# Patient Record
Sex: Female | Born: 1990 | Race: Black or African American | Hispanic: No | Marital: Single | Smoking: Never smoker
Health system: Southern US, Community
[De-identification: ages and names within clinical notes are randomized; demographics above are authoritative.]

## PROBLEM LIST (undated history)

## (undated) ENCOUNTER — Inpatient Hospital Stay (HOSPITAL_COMMUNITY): Payer: Self-pay

## (undated) DIAGNOSIS — D689 Coagulation defect, unspecified: Secondary | ICD-10-CM

## (undated) DIAGNOSIS — R519 Headache, unspecified: Secondary | ICD-10-CM

## (undated) DIAGNOSIS — D3910 Neoplasm of uncertain behavior of unspecified ovary: Secondary | ICD-10-CM

## (undated) DIAGNOSIS — Z98891 History of uterine scar from previous surgery: Secondary | ICD-10-CM

## (undated) DIAGNOSIS — O26899 Other specified pregnancy related conditions, unspecified trimester: Secondary | ICD-10-CM

## (undated) DIAGNOSIS — R51 Headache: Secondary | ICD-10-CM

## (undated) DIAGNOSIS — O139 Gestational [pregnancy-induced] hypertension without significant proteinuria, unspecified trimester: Secondary | ICD-10-CM

## (undated) DIAGNOSIS — O1423 HELLP syndrome (HELLP), third trimester: Secondary | ICD-10-CM

## (undated) DIAGNOSIS — G709 Myoneural disorder, unspecified: Secondary | ICD-10-CM

## (undated) DIAGNOSIS — F419 Anxiety disorder, unspecified: Secondary | ICD-10-CM

## (undated) HISTORY — DX: Coagulation defect, unspecified: D68.9

## (undated) HISTORY — PX: OTHER SURGICAL HISTORY: SHX169

## (undated) HISTORY — DX: Headache, unspecified: R51.9

## (undated) HISTORY — DX: Other specified pregnancy related conditions, unspecified trimester: O26.899

## (undated) HISTORY — PX: JOINT REPLACEMENT: SHX530

## (undated) HISTORY — DX: Headache: R51

---
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
## 2011-02-02 LAB — COMPREHENSIVE METABOLIC PANEL
ALT: 15
AST: 23
AST: 26
Albumin: 1.6 — ABNORMAL LOW
Albumin: 2.3 — ABNORMAL LOW
Alkaline Phosphatase: 262 — ABNORMAL HIGH
Alkaline Phosphatase: 310 — ABNORMAL HIGH
BUN: 12
BUN: 12
BUN: 13
CO2: 19
CO2: 19
Calcium: 7 — ABNORMAL LOW
Calcium: 7.6 — ABNORMAL LOW
Calcium: 8.9
Chloride: 106
Chloride: 109
Creatinine, Ser: 1.1
Creatinine, Ser: 1.26 — ABNORMAL HIGH
Glucose, Bld: 108 — ABNORMAL HIGH
Glucose, Bld: 78
Potassium: 3.8
Potassium: 4.3
Sodium: 135
Total Bilirubin: 0.5
Total Bilirubin: 0.7
Total Protein: 3.8 — ABNORMAL LOW
Total Protein: 4.3 — ABNORMAL LOW
Total Protein: 5.7 — ABNORMAL LOW

## 2011-02-02 LAB — URINALYSIS, DIPSTICK ONLY
Bilirubin Urine: NEGATIVE
Glucose, UA: NEGATIVE
Ketones, ur: 15 — AB
pH: 6

## 2011-02-02 LAB — CBC
HCT: 17.8 — ABNORMAL LOW
HCT: 30.2 — ABNORMAL LOW
HCT: 35
Hemoglobin: 10.1 — ABNORMAL LOW
Hemoglobin: 11.5
Hemoglobin: 6 — CL
MCHC: 32.8
MCHC: 33.3
MCV: 86.3
MCV: 86.6
MCV: 86.8
Platelets: 160
Platelets: 183
Platelets: 191
RBC: 2.05 — ABNORMAL LOW
RBC: 3.49 — ABNORMAL LOW
RBC: 4.04
RDW: 13.3
RDW: 13.4
RDW: 13.4
WBC: 11
WBC: 6.8
WBC: 9.5

## 2011-02-02 LAB — BASIC METABOLIC PANEL
Chloride: 109
Potassium: 4.3
Sodium: 137

## 2011-02-02 LAB — MAGNESIUM: Magnesium: 6.4

## 2011-02-02 LAB — LACTATE DEHYDROGENASE
LDH: 244
LDH: 293 — ABNORMAL HIGH
LDH: 341 — ABNORMAL HIGH

## 2011-02-02 LAB — RPR: RPR Ser Ql: NONREACTIVE

## 2011-02-02 LAB — URIC ACID: Uric Acid, Serum: 8.6 — ABNORMAL HIGH

## 2011-07-12 ENCOUNTER — Emergency Department (INDEPENDENT_AMBULATORY_CARE_PROVIDER_SITE_OTHER): Payer: Self-pay

## 2011-07-12 ENCOUNTER — Other Ambulatory Visit: Payer: Self-pay

## 2011-07-12 ENCOUNTER — Encounter (HOSPITAL_COMMUNITY): Payer: Self-pay

## 2011-07-12 ENCOUNTER — Emergency Department (INDEPENDENT_AMBULATORY_CARE_PROVIDER_SITE_OTHER)
Admission: EM | Admit: 2011-07-12 | Discharge: 2011-07-12 | Disposition: A | Discharge status: Home / Self Care | Payer: Self-pay | Source: Home / Self Care | Attending: Emergency Medicine | Admitting: Emergency Medicine

## 2011-07-12 DIAGNOSIS — F410 Panic disorder [episodic paroxysmal anxiety] without agoraphobia: Secondary | ICD-10-CM

## 2011-07-12 DIAGNOSIS — R102 Pelvic and perineal pain: Secondary | ICD-10-CM

## 2011-07-12 DIAGNOSIS — N949 Unspecified condition associated with female genital organs and menstrual cycle: Secondary | ICD-10-CM

## 2011-07-12 DIAGNOSIS — G44209 Tension-type headache, unspecified, not intractable: Secondary | ICD-10-CM

## 2011-07-12 LAB — CBC
HCT: 42.3 % (ref 36.0–46.0)
Hemoglobin: 14.8 g/dL (ref 12.0–15.0)
MCHC: 35 g/dL (ref 30.0–36.0)
MCV: 83.9 fL (ref 78.0–100.0)
RDW: 12.5 % (ref 11.5–15.5)
WBC: 5.9 10*3/uL (ref 4.0–10.5)

## 2011-07-12 LAB — WET PREP, GENITAL: Clue Cells Wet Prep HPF POC: NONE SEEN

## 2011-07-12 LAB — DIFFERENTIAL
Eosinophils Relative: 4 % (ref 0–5)
Lymphocytes Relative: 38 % (ref 12–46)
Monocytes Absolute: 0.4 10*3/uL (ref 0.1–1.0)
Monocytes Relative: 7 % (ref 3–12)
Neutro Abs: 3 10*3/uL (ref 1.7–7.7)

## 2011-07-12 LAB — POCT URINALYSIS DIP (DEVICE)
Hgb urine dipstick: NEGATIVE
Ketones, ur: NEGATIVE mg/dL
Protein, ur: NEGATIVE mg/dL
Specific Gravity, Urine: 1.02 (ref 1.005–1.030)
Urobilinogen, UA: 1 mg/dL (ref 0.0–1.0)

## 2011-07-12 LAB — POCT PREGNANCY, URINE: Preg Test, Ur: NEGATIVE

## 2011-07-12 MED ORDER — LORAZEPAM 1 MG PO TABS: 1.0000 mg | ORAL_TABLET | Freq: Three times a day (TID) | ORAL | Status: AC | PRN

## 2011-07-12 MED ORDER — AMITRIPTYLINE HCL 10 MG PO TABS: 10.0000 mg | ORAL_TABLET | Freq: Every day | ORAL | Status: DC

## 2011-07-12 MED ORDER — NAPROXEN 375 MG PO TABS: 375.0000 mg | ORAL_TABLET | Freq: Two times a day (BID) | ORAL | Status: DC

## 2011-07-12 NOTE — Discharge Instructions (Signed)
Abdominal Pain, Women       Abdominal (stomach, pelvic, or belly) pain can be caused by many things. It is important to tell your doctor:   The location of the pain.   Does it come and go or is it present all the time?   Are there things that start the pain (eating certain foods, exercise)?   Are there other symptoms associated with the pain (fever, nausea, vomiting, diarrhea)?  All of this is helpful to know when trying to find the cause of the pain.   CAUSES   Stomach: virus or bacteria infection, or ulcer.   Intestine: appendicitis (inflamed appendix), regional ileitis (Crohn's disease), ulcerative colitis (inflamed colon), irritable bowel syndrome, diverticulitis (inflamed diverticulum of the colon), or cancer of the stomach or intestine.   Gallbladder disease or stones in the gallbladder.   Kidney disease, kidney stones, or infection.   Pancreas infection or cancer.   Fibromyalgia (pain disorder).   Diseases of the female organs:   Uterus: fibroid (non-cancerous) tumors or infection.   Fallopian tubes: infection or tubal pregnancy.   Ovary: cysts or tumors.   Pelvic adhesions (scar tissue).   Endometriosis (uterus lining tissue growing in the pelvis and on the pelvic organs).   Pelvic congestion syndrome (female organs filling up with blood just before the menstrual period).   Pain with the menstrual period.   Pain with ovulation (producing an egg).   Pain with an IUD (intrauterine device, birth control) in the uterus.   Cancer of the female organs.   Functional pain (pain not caused by a disease, may improve without treatment).   Psychological pain.   Depression.  DIAGNOSIS   Your doctor will decide the seriousness of your pain by doing an examination.   Blood tests.   X-rays.   Ultrasound.   CT scan (computed tomography, special type of X-ray).   MRI (magnetic resonance imaging).   Cultures, for infection.   Barium enema (dye inserted in the large intestine, to better view it with X-rays).   Colonoscopy  (looking in intestine with a lighted tube).   Laparoscopy (minor surgery, looking in abdomen with a lighted tube).   Major abdominal exploratory surgery (looking in abdomen with a large incision).  TREATMENT   The treatment will depend on the cause of the pain.   Many cases can be observed and treated at home.   Over-the-counter medicines recommended by your caregiver.   Prescription medicine.   Antibiotics, for infection.   Birth control pills, for painful periods or for ovulation pain.   Hormone treatment, for endometriosis.   Nerve blocking injections.   Physical therapy.   Antidepressants.   Counseling with a psychologist or psychiatrist.   Minor or major surgery.  HOME CARE INSTRUCTIONS   Do not take laxatives, unless directed by your caregiver.   Take over-the-counter pain medicine only if ordered by your caregiver. Do not take aspirin because it can cause an upset stomach or bleeding.   Try a clear liquid diet (broth or water) as ordered by your caregiver. Slowly move to a bland diet, as tolerated, if the pain is related to the stomach or intestine.   Have a thermometer and take your temperature several times a day, and record it.   Bed rest and sleep, if it helps the pain.   Avoid sexual intercourse, if it causes pain.   Avoid stressful situations.   Keep your follow-up appointments and tests, as your caregiver orders.   If   try:   Acupuncture.   Relaxation exercises (yoga, meditation).   Group therapy.   Counseling.  SEEK MEDICAL CARE IF:   You notice certain foods cause stomach pain.   Your home care treatment is not helping your pain.   You need stronger pain medicine.   You want your IUD removed.   You feel faint or lightheaded.   You develop nausea and vomiting.   You develop a rash.   You are having side effects or  an allergy to your medicine.  SEEK IMMEDIATE MEDICAL CARE IF:   Your pain does not go away or gets worse.   You have a fever.   Your pain is felt only in portions of the abdomen. The right side could possibly be appendicitis. The left lower portion of the abdomen could be colitis or diverticulitis.   You are passing blood in your stools (bright red or black tarry stools, with or without vomiting).   You have blood in your urine.   You develop chills, with or without a fever.   You pass out.  MAKE SURE YOU:   Understand these instructions.   Will watch your condition.   Will get help right away if you are not doing well or get worse.  Document Released: 02/14/2007 Document Revised: 04/08/2011 Document Reviewed: 03/06/2009 [facility] Patient Information 2012 [place], [place].Anxiety and Panic Attacks Anxiety is your body's way of reacting to real danger or something you think is a danger. It may be fear or worry over a situation like losing your job. Sometimes the cause is not known. A panic attack is made up of physical signs like sweating, shaking, or chest pain. Anxiety and panic attacks may start suddenly. They may be strong. They may come at any time of day, even while sleeping. They may come at any time of life. Panic attacks are scary, but they do not harm you physically.  HOME CARE  Avoid any known causes of your anxiety.   Try to relax. Yoga may help. Tell yourself everything will be okay.   Exercise often.   Get expert advice and help (therapy) to stop anxiety or attacks from happening.   Avoid caffeine, alcohol, and drugs.   Only take medicine as told by your doctor.  GET HELP RIGHT AWAY IF:  Your attacks seem different than normal attacks.   Your problems are getting worse or concern you.  MAKE SURE YOU:  Understand these instructions.   Will watch your condition.   Will get help right away if you are not doing well or get worse.  Document Released:  05/22/2010 Document Revised: 04/08/2011 Document Reviewed: 05/22/2010 [facility] Patient Information 2012 [place], [place].Tension Headache (Muscle Contraction Headache) Tension headache is one of the most common causes of head pain. These headaches are usually felt as a pain over the top of your head and back of your neck. Stress, anxiety, and depression are common triggers for these headaches. Tension headaches are not life-threatening and will not lead to other types of headaches. Tension headaches can often be diagnosed by taking a history from the patient and a physical exam. Sometimes, further lab and x-ray studies are used to confirm the diagnosis. Your caregiver can advise you on how to get help solving problems that cause anxiety or stress. Antidepressants can be prescribed if depression is a problem. HOME CARE INSTRUCTIONS   If testing was done, call for your results. Remember, it is your responsibility to get the results of all testing. Do  not assume everything is fine because you do not hear from your caregiver.   Only take over-the-counter or prescription medicines for pain, discomfort, or fever as directed by your caregiver.   Biofeedback, massage, or other relaxation techniques may be helpful.   Ice packs or heat to the head and neck can be used. Use these three to four times per day or as needed.   Physical therapy may be a useful addition to treatment.   If headaches continue, even with therapy, you may need to think about lifestyle changes.   Avoid excessive use of pain killers, as rebound headaches can occur.  SEEK MEDICAL CARE IF:   You develop problems with medications prescribed.   You do not respond or get no relief from medications.   You have a change from the usual headache.   You develop nausea (feeling sick to your stomach) or vomiting.  SEEK IMMEDIATE MEDICAL CARE IF:   Your headache becomes severe.   You have an unexplained oral temperature above 102 F  (38.9 C).   You develop a stiff neck.   You have loss of vision.   You have muscular weakness.   You have loss of muscular control.   You develop severe symptoms different from your first symptoms.   You start losing your balance or have trouble walking.   You feel faint or pass out.  MAKE SURE YOU:   Understand these instructions.   Will watch your condition.   Will get help right away if you are not doing well or get worse.  Document Released: 04/19/2005 Document Revised: 04/08/2011 Document Reviewed: 12/07/2007 [facility] Patient Information 2012 [place], [place].Abdominal Pain, Women Abdominal (stomach, pelvic, or belly) pain can be caused by many things. It is important to tell your doctor:  The location of the pain.   Does it come and go or is it present all the time?   Are there things that start the pain (eating certain foods, exercise)?   Are there other symptoms associated with the pain (fever, nausea, vomiting, diarrhea)?  All of this is helpful to know when trying to find the cause of the pain. CAUSES   Stomach: virus or bacteria infection, or ulcer.   Intestine: appendicitis (inflamed appendix), regional ileitis (Crohn's disease), ulcerative colitis (inflamed colon), irritable bowel syndrome, diverticulitis (inflamed diverticulum of the colon), or cancer of the stomach or intestine.   Gallbladder disease or stones in the gallbladder.   Kidney disease, kidney stones, or infection.   Pancreas infection or cancer.   Fibromyalgia (pain disorder).   Diseases of the female organs:   Uterus: fibroid (non-cancerous) tumors or infection.   Fallopian tubes: infection or tubal pregnancy.   Ovary: cysts or tumors.   Pelvic adhesions (scar tissue).   Endometriosis (uterus lining tissue growing in the pelvis and on the pelvic organs).   Pelvic congestion syndrome (female organs filling up with blood just before the menstrual period).   Pain with the  menstrual period.   Pain with ovulation (producing an egg).   Pain with an IUD (intrauterine device, birth control) in the uterus.   Cancer of the female organs.   Functional pain (pain not caused by a disease, may improve without treatment).   Psychological pain.   Depression.  DIAGNOSIS  Your doctor will decide the seriousness of your pain by doing an examination.  Blood tests.   X-rays.   Ultrasound.   CT scan (computed tomography, special type of X-ray).   MRI (magnetic  resonance imaging).   Cultures, for infection.   Barium enema (dye inserted in the large intestine, to better view it with X-rays).   Colonoscopy (looking in intestine with a lighted tube).   Laparoscopy (minor surgery, looking in abdomen with a lighted tube).   Major abdominal exploratory surgery (looking in abdomen with a large incision).  TREATMENT  The treatment will depend on the cause of the pain.   Many cases can be observed and treated at home.   Over-the-counter medicines recommended by your caregiver.   Prescription medicine.   Antibiotics, for infection.   Birth control pills, for painful periods or for ovulation pain.   Hormone treatment, for endometriosis.   Nerve blocking injections.   Physical therapy.   Antidepressants.   Counseling with a psychologist or psychiatrist.   Minor or major surgery.  HOME CARE INSTRUCTIONS   Do not take laxatives, unless directed by your caregiver.   Take over-the-counter pain medicine only if ordered by your caregiver. Do not take aspirin because it can cause an upset stomach or bleeding.   Try a clear liquid diet (broth or water) as ordered by your caregiver. Slowly move to a bland diet, as tolerated, if the pain is related to the stomach or intestine.   Have a thermometer and take your temperature several times a day, and record it.   Bed rest and sleep, if it helps the pain.   Avoid sexual intercourse, if it causes pain.    Avoid stressful situations.   Keep your follow-up appointments and tests, as your caregiver orders.   If the pain does not go away with medicine or surgery, you may try:   Acupuncture.   Relaxation exercises (yoga, meditation).   Group therapy.   Counseling.  SEEK MEDICAL CARE IF:   You notice certain foods cause stomach pain.   Your home care treatment is not helping your pain.   You need stronger pain medicine.   You want your IUD removed.   You feel faint or lightheaded.   You develop nausea and vomiting.   You develop a rash.   You are having side effects or an allergy to your medicine.  SEEK IMMEDIATE MEDICAL CARE IF:   Your pain does not go away or gets worse.   You have a fever.   Your pain is felt only in portions of the abdomen. The right side could possibly be appendicitis. The left lower portion of the abdomen could be colitis or diverticulitis.   You are passing blood in your stools (bright red or black tarry stools, with or without vomiting).   You have blood in your urine.   You develop chills, with or without a fever.   You pass out.  MAKE SURE YOU:   Understand these instructions.   Will watch your condition.   Will get help right away if you are not doing well or get worse.  Document Released: 02/14/2007 Document Revised: 04/08/2011 Document Reviewed: 03/06/2009 [facility] Patient Information 2012 [place], [place].

## 2011-07-12 NOTE — ED Notes (Signed)
[phone] or [phone] for test reports

## 2011-07-12 NOTE — ED Notes (Signed)
States for several months she has had tightness in breasts/chest, nausea, headaches, abdominal cramping and it hurts to lie on her abdomen at night.  Reports she has intermittently noticed knots in the back of her legs- states she noted one last pm.  Pt first stated she had last depo shot in December 2012 and then said she may have missed that one. Reports she can't see her OB/Gyn because she owes them money.

## 2011-07-12 NOTE — ED Provider Notes (Signed)
Chief Complaint  Patient presents with  . Nausea  . Abdominal Cramping    History of Present Illness:   The patient is a 21 year old female who presents with a number of complaints today all of which have been going on for about 2-3 months: Recurring headaches, nausea, chest tightness and pain, and lower abdominal pain.  She has had recurring headaches for the past 2 or 3 months. These are located in the right frontal area and feels like a pressure. They're rated as a 9 or 10 in intensity. They tend to occur in the morning, after physical education, after getting home from school. They usually occur about 3 times a day and last about 45 minutes. They're associated with photophobia but no photophobia and no associated nausea or vomiting. She states her legs have felt numb for months she's felt stressed, anxious, and not getting enough sleep. She denies being depressed. Denies any other neurological symptoms.   Also for the past 2-3 months she's felt intermittently nauseated. This tends to come on after the headaches and can last for 30 minutes at a time. She states her appetite is good her weight is up. She denies any abdominal pain, vomiting, or diarrhea. She uses the Provera shots for birth control. She thinks her last one was in December. Her last menses were in November. She is sexually active and  Also for the past 2 weeks she's had intermittent episodes of chest tightness and pain occurring about every 3 days. These can last about 3 minutes at a time. She denies any associated palpitations or shortness of breath.  Also for the past 4 months she's had intermittent bilateral lower abdominal pain. The pain can last for hours at a time or sometimes all night. It's worse if she lies flat on her abdomen. It's described as a stabbing pain is rated 8/10 in intensity. She's had some vaginal discharge. No dysuria, frequency, urgency, or hematuria.  Review of Systems:  Other than noted above, the patient  denies any of the following symptoms. Systemic:  No fever, chills, sweats, fatigue, myalgias, headache, or anorexia. Eye:  No redness, pain or drainage. ENT:  No earache, nasal congestion, rhinorrhea, sinus pressure, or sore throat. Lungs:  No cough, sputum production, wheezing, shortness of breath.  Cardiovascular:  No chest pain, palpitations, or syncope. GI:  No nausea, vomiting, abdominal pain or diarrhea. GU:  No dysuria, frequency, or hematuria. Skin:  No rash or pruritis.  PMFSH:  Past medical history, family history, social history, meds, and allergies were reviewed.  Physical Exam:   Vital signs:  BP 122/67  Pulse 80  Temp(Src) 98.8 F (37.1 C) (Oral)  Resp 14  SpO2 98% General:  Alert, in no distress. Eye:  PERRL, full EOMs.  Lids and conjunctivas were normal. ENT:  TMs and canals were normal, without erythema or inflammation.  Nasal mucosa was clear and uncongested, without drainage.  Mucous membranes were moist.  Pharynx was clear, without exudate or drainage.  There were no oral ulcerations or lesions. Neck:  Supple, no adenopathy, tenderness or mass. Thyroid was normal. Lungs:  No respiratory distress.  Lungs were clear to auscultation, without wheezes, rales or rhonchi.  Breath sounds were clear and equal bilaterally. Heart:  Regular rhythm, without gallops, murmers or rubs. Abdomen:  Soft, flat, and non-tender to palpation.  No hepatosplenomagaly or mass. Pelvic: Normal external genitalia, vaginal and cervical mucosa unremarkable, no cervical motion tenderness, uterus normal size and nontender, no adnexal mass or tenderness  on the left, moderate adnexal tenderness and no mass on the right. Neurological exam: Alert and oriented x3. Speech was clear and appropriate. Cranial nerves were intact. No pronator drift, finger to nose normal, hand grips equal and DTRs were 2+ and symmetrical. Muscle strength and sensation were normal. Skin:  Clear, warm, and dry, without rash or  lesions.  Labs:   Results for orders placed during the hospital encounter of 07/12/11  POCT URINALYSIS DIP (DEVICE)      Component Value Range   Glucose, UA NEGATIVE  NEGATIVE (mg/dL)   Bilirubin Urine NEGATIVE  NEGATIVE    Ketones, ur NEGATIVE  NEGATIVE (mg/dL)   Specific Gravity, Urine 1.020  1.005 - 1.030    Hgb urine dipstick NEGATIVE  NEGATIVE    pH 7.0  5.0 - 8.0    Protein, ur NEGATIVE  NEGATIVE (mg/dL)   Urobilinogen, UA 1.0  0.0 - 1.0 (mg/dL)   Nitrite NEGATIVE  NEGATIVE    Leukocytes, UA NEGATIVE  NEGATIVE   POCT PREGNANCY, URINE      Component Value Range   Preg Test, Ur NEGATIVE  NEGATIVE   CBC      Component Value Range   WBC 5.9  4.0 - 10.5 (K/uL)   RBC 5.04  3.87 - 5.11 (MIL/uL)   Hemoglobin 14.8  12.0 - 15.0 (g/dL)   HCT 40.9  81.1 - 91.4 (%)   MCV 83.9  78.0 - 100.0 (fL)   MCH 29.4  26.0 - 34.0 (pg)   MCHC 35.0  30.0 - 36.0 (g/dL)   RDW 78.2  95.6 - 21.3 (%)   Platelets 242  150 - 400 (K/uL)  DIFFERENTIAL      Component Value Range   Neutrophils Relative 51  43 - 77 (%)   Neutro Abs 3.0  1.7 - 7.7 (K/uL)   Lymphocytes Relative 38  12 - 46 (%)   Lymphs Abs 2.3  0.7 - 4.0 (K/uL)   Monocytes Relative 7  3 - 12 (%)   Monocytes Absolute 0.4  0.1 - 1.0 (K/uL)   Eosinophils Relative 4  0 - 5 (%)   Eosinophils Absolute 0.2  0.0 - 0.7 (K/uL)   Basophils Relative 1  0 - 1 (%)   Basophils Absolute 0.0  0.0 - 0.1 (K/uL)  WET PREP, GENITAL      Component Value Range   Yeast Wet Prep HPF POC NONE SEEN  NONE SEEN    Trich, Wet Prep NONE SEEN  NONE SEEN    Clue Cells Wet Prep HPF POC NONE SEEN  NONE SEEN    WBC, Wet Prep HPF POC FEW (*) NONE SEEN      Radiology:  Dg Chest 2 View  07/12/2011  *RADIOLOGY REPORT*  Clinical Data: Chest pain, pressure.  CHEST - 2 VIEW  Comparison: None.  Findings: Heart and mediastinal contours are within normal limits. No focal opacities or effusions.  No acute bony abnormality.  IMPRESSION: No active cardiopulmonary disease.  Original  Report Authenticated By: Cyndie Chime, M.D.    Date: 07/12/2011  Rate: 69  Rhythm: normal sinus rhythm  QRS Axis: normal  Intervals: normal  ST/T Wave abnormalities: normal  Conduction Disutrbances:none  Narrative Interpretation: Normal sinus rhythm, normal EKG.  Old EKG Reviewed: none available    Assessment:   Diagnoses that have been ruled out:  None  Diagnoses that are still under consideration:  None  Final diagnoses:  Tension headache  Anxiety attack  Pelvic pain -  I don't think she has PID or an STD. She may have very insistent on the right or fibroid tumor as a cause for her pain. I think she needs further evaluation suggested following up with gynecologist.     Plan:   1.  The following meds were prescribed:   New Prescriptions   AMITRIPTYLINE (ELAVIL) 10 MG TABLET    Take 1 tablet (10 mg total) by mouth at bedtime.   LORAZEPAM (ATIVAN) 1 MG TABLET    Take 1 tablet (1 mg total) by mouth 3 (three) times daily as needed for anxiety.   NAPROXEN (NAPROSYN) 375 MG TABLET    Take 1 tablet (375 mg total) by mouth 2 (two) times daily with a meal.   2.  The patient was instructed in symptomatic care and handouts were given. 3.  The patient was told to return if becoming worse in any way, if no better in 3 or 4 days, and given some red flag symptoms that would indicate earlier return.  Follow up:  The patient was told to follow up with Dr. Deidre Ala for the pelvic pain in one week, and with Dr. Yolonda Kida if headaches aren't any better in a month.    Reuben Likes, MD 07/12/11 [phone]

## 2011-08-06 ENCOUNTER — Emergency Department (HOSPITAL_COMMUNITY): Payer: Medicaid Other

## 2011-08-06 ENCOUNTER — Emergency Department (HOSPITAL_COMMUNITY)
Admission: EM | Admit: 2011-08-06 | Discharge: 2011-08-06 | Disposition: A | Discharge status: Home / Self Care | Payer: Medicaid Other | Attending: Emergency Medicine | Admitting: Emergency Medicine

## 2011-08-06 ENCOUNTER — Encounter (HOSPITAL_COMMUNITY): Payer: Self-pay | Admitting: *Deleted

## 2011-08-06 DIAGNOSIS — O200 Threatened abortion: Secondary | ICD-10-CM | POA: Insufficient documentation

## 2011-08-06 LAB — CBC
HCT: 41.9 % (ref 36.0–46.0)
MCH: 29.5 pg (ref 26.0–34.0)
MCV: 84.6 fL (ref 78.0–100.0)
RBC: 4.95 MIL/uL (ref 3.87–5.11)
RDW: 12.8 % (ref 11.5–15.5)
WBC: 6.6 10*3/uL (ref 4.0–10.5)

## 2011-08-06 LAB — URINALYSIS, ROUTINE W REFLEX MICROSCOPIC
Nitrite: NEGATIVE
Specific Gravity, Urine: 1.024 (ref 1.005–1.030)
Urobilinogen, UA: 0.2 mg/dL (ref 0.0–1.0)

## 2011-08-06 LAB — COMPREHENSIVE METABOLIC PANEL
CO2: 23 mEq/L (ref 19–32)
Calcium: 8.6 mg/dL (ref 8.4–10.5)
Creatinine, Ser: 0.78 mg/dL (ref 0.50–1.10)
GFR calc Af Amer: >90 mL/min (ref >90)
GFR calc non Af Amer: >90 mL/min (ref >90)
Glucose, Bld: 132 mg/dL — ABNORMAL HIGH (ref 70–99)

## 2011-08-06 LAB — DIFFERENTIAL
Eosinophils Relative: 1 % (ref 0–5)
Lymphocytes Relative: 30 % (ref 12–46)
Lymphs Abs: 2 10*3/uL (ref 0.7–4.0)
Monocytes Absolute: 0.3 10*3/uL (ref 0.1–1.0)

## 2011-08-06 LAB — POCT PREGNANCY, URINE: Preg Test, Ur: POSITIVE — AB

## 2011-08-06 LAB — RH IG WORKUP (INCLUDES ABO/RH)

## 2011-08-06 LAB — LIPASE, BLOOD: Lipase: 17 U/L (ref 11–59)

## 2011-08-06 MED ORDER — SODIUM CHLORIDE 0.9 % IV SOLN
1000.0000 mL | Freq: Once | INTRAVENOUS | Status: AC
  Administered 2011-08-06: 1000 mL via INTRAVENOUS

## 2011-08-06 MED ORDER — SODIUM CHLORIDE 0.9 % IV SOLN: 1000.0000 mL | INTRAVENOUS | Status: DC

## 2011-08-06 MED ORDER — HYDROMORPHONE HCL PF 1 MG/ML IJ SOLN
1.0000 mg | Freq: Once | INTRAMUSCULAR | Status: AC
  Administered 2011-08-06: 1 mg via INTRAVENOUS
  Filled 2011-08-06: qty 1

## 2011-08-06 MED ORDER — ONDANSETRON HCL 4 MG/2ML IJ SOLN
4.0000 mg | Freq: Once | INTRAMUSCULAR | Status: AC
  Administered 2011-08-06: 4 mg via INTRAVENOUS
  Filled 2011-08-06: qty 2

## 2011-08-06 NOTE — ED Notes (Signed)
Patient states that abdominal pain started this morning about 6am. She described the pain a sharp pain that is constant.  She rates her  Pain at a 8 of 10 currently.  She confirms nausea, vomiting and diarrhea She denies blood in diarrhea or vomit

## 2011-08-06 NOTE — ED Notes (Signed)
Patient is alert and oriented x3.  She was given DC instructions and follow up visit instructions.  Patient gave verbal understanding. She was DC ambulatory under his own power to home.  V/S stable.  He was not showing any signs of distress on DC 

## 2011-08-06 NOTE — Discharge Instructions (Signed)
Threatened Miscarriage  A threatened miscarriage is a pregnancy that may end. It may be marked by bleeding during the first 20 weeks of pregnancy. Often, the pregnancy can continue without any more problems. You may be asked to stop:  Having sex (intercourse).   Having orgasms.   Using tampons.   Exercising.   Doing heavy physical activity and work.  HOME CARE   Your doctor may tell you to take bed rest and to stop activities and work.   Write down the number of pads you use each day. Write down how often you change pads. Write down how soaked they are.   Follow your doctor's advice for follow-up visits and tests.   If your blood type is Rh-negative and the father's blood is Rh-positive (or is not known), you may get a shot to protect the baby.   If you have a miscarriage, save all the tissue you pass in a container. Take the container to your doctor.  GET HELP RIGHT AWAY IF:   You have bad cramps or pain in your belly (abdomen), lower belly, or back.   You have a fever or chills.   Your bleeding gets worse or you pass large clots of blood or tissue. Save this tissue to show your doctor.   You feel lightheaded, weak, dizzy, or pass out (faint).   You have a gush of fluid from your vagina.  MAKE SURE YOU:   Understand these instructions.   Will watch your condition.   Will get help right away if you are not doing well or get worse.  Document Released: 04/01/2008 Document Revised: 04/08/2011 Document Reviewed: 05/05/2009 [employer] Patient Information 2012 [employer]. 
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
## 2011-09-06 ENCOUNTER — Other Ambulatory Visit (HOSPITAL_COMMUNITY): Payer: Self-pay | Admitting: Obstetrics and Gynecology

## 2011-09-06 DIAGNOSIS — O26849 Uterine size-date discrepancy, unspecified trimester: Secondary | ICD-10-CM

## 2011-09-07 ENCOUNTER — Ambulatory Visit (HOSPITAL_COMMUNITY)
Admission: RE | Admit: 2011-09-07 | Discharge: 2011-09-07 | Disposition: A | Discharge status: Home / Self Care | Payer: Medicaid Other | Source: Ambulatory Visit | Attending: Obstetrics and Gynecology | Admitting: Obstetrics and Gynecology

## 2011-09-07 DIAGNOSIS — O209 Hemorrhage in early pregnancy, unspecified: Secondary | ICD-10-CM | POA: Insufficient documentation

## 2011-09-07 DIAGNOSIS — R1031 Right lower quadrant pain: Secondary | ICD-10-CM | POA: Insufficient documentation

## 2011-09-07 DIAGNOSIS — O26849 Uterine size-date discrepancy, unspecified trimester: Secondary | ICD-10-CM

## 2011-10-04 LAB — OB RESULTS CONSOLE RPR: RPR: NONREACTIVE

## 2011-10-06 LAB — OB RESULTS CONSOLE ABO/RH

## 2011-11-12 ENCOUNTER — Ambulatory Visit (INDEPENDENT_AMBULATORY_CARE_PROVIDER_SITE_OTHER): Payer: Medicaid Other | Admitting: Psychiatry

## 2011-11-12 ENCOUNTER — Encounter (HOSPITAL_COMMUNITY): Payer: Self-pay | Admitting: Psychiatry

## 2011-11-12 DIAGNOSIS — F4322 Adjustment disorder with anxiety: Secondary | ICD-10-CM

## 2011-11-12 NOTE — Progress Notes (Signed)
Psychiatric Assessment Adult  Patient Identification:  Carla [name] Date of Evaluation:  11/12/2011 Chief Complaint: I keep on jumping on the bedHistory of Chief Complaint:  No chief complaint on file.  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 The patient is clearly intimidated by her social environment. She is very guarded and says that sometimes she thinks people are talking about her. Presently she is 4 months pregnant. She lives with her mother her 2 brothers her niece her boyfriend and her 39-year-old son. She feels good about her boyfriend has been with her for 6 years. This patient denies daily depression. She denies any problems with sleep appetite or energy. The patient not anhedonic as she likes music and likes to cut hair. She is a good sense of worth and denies ever being suicidal. The patient denies the use of alcohol or drugs. She has never been psychotic. She denies any symptoms of mania the denies any symptoms of any specific anxiety disorder. All their some obsessive features about her she does not meet the criteria of OCD. She likes things ordered but that's awaits been since she was a young child. This patient was seen alone. HPI Review of Systems Physical Exam  Depressive Symptoms: anxiety,  (Hypo) Manic Symptoms:   Elevated Mood:  No Irritable Mood:  No Grandiosity:  No Distractibility:  No Labiality of Mood:  No Delusions:  No Hallucinations:  No Impulsivity:  No Sexually Inappropriate Behavior:  No Financial Extravagance:  No Flight of Ideas:  No  Anxiety Symptoms: Excessive Worry:  No Panic Symptoms:  No Agoraphobia:  No Obsessive Compulsive: No  Symptoms: nonr Specific Phobias:  No Social Anxiety:  No  Psychotic Symptoms:  Hallucinations: No None  Delusions:  No Paranoia:  No   Ideas of  Reference:  No  PTSD Symptoms: Ever had a traumatic exposure:  No Had a traumatic exposure in the last month:  No Re-experiencing: No None Hypervigilance:  No Hyperarousal: No None Avoidance: Yes None  Traumatic Brain Injury: No   Past Psychiatric History:none                Past Medical History:  No past medical history on file. History of Loss of Consciousness:  No Seizure History:  No Cardiac History:  No Allergies:  No Known Allergies Current Medications:  Current Outpatient Prescriptions  Medication Sig Dispense Refill  . amitriptyline (ELAVIL) 10 MG tablet Take 1 tablet (10 mg total) by mouth at bedtime.  30 tablet  0  . naproxen (NAPROSYN) 375 MG tablet Take 1 tablet (375 mg total) by mouth 2 (two) times daily with a meal.  30 tablet  0  . OVER THE COUNTER MEDICATION Take 1 tablet by mouth daily as needed. OTC nausea pill.        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 Legal History:  Hobbies/Interests:   Family History:  No family history on file.  Mental Status Examination/Evaluation: Objective:  Appearance: Casual  Eye Contact::  Good  Speech:  Normal Rate  Volume:  Normal  Mood:  Depressed  Affect:  Appropriate    Thought Process:  Coherent  Orientation:  Full  Thought Content:  WDL  Suicidal Thoughts:  No  Homicidal Thoughts:  No  Judgement:  Good  Insight:  Good  Psychomotor Activity:  Normal  Akathisia:  No  Handed:  Right  AIMS (if indicated):    Assets:  Communication Skills    Laboratory/X-Ray Psychological Evaluation(s)        Assessment:  Axis I: Adjustment Disorder with Anxiety  AXIS I Adjustment Disorder with Anxiety  AXIS II Deferred  AXIS III No past medical history on file.   AXIS IV other psychosocial or environmental problems  AXIS V 61-70 mild symptoms   Treatment Plan/Recommendations:  Plan of Care: At this time and having difficulty defining a specific anxiety disorder. Even if that was the case I would be hesitant to give this pregnant young woman a psychotropic agent. I think her next appointment would be more beneficial if she was seen together with some family. Nonetheless apparently was involved in her life are concerned about how anxious she is. She says they're concerned that she is jumping on the bed and says that she has wracked a number of beds. She has difficulty being out in public. At this time I will go ahead and refer her to [facility] Slim with the assumption that her second pregnancy at age 83 main fact be producing some issues. This patient return to see me in 7 weeks.   Laboratory:    Psychotherapy: Refer ti  Forde Radon  Medications: none  Routine PRN Medications:  No  Consultations:   Safety Concerns:    Other:      Lucas Mallow, MD 7/12/201311:49 AM

## 2011-12-16 ENCOUNTER — Ambulatory Visit (HOSPITAL_COMMUNITY): Payer: Medicaid Other | Admitting: Psychology

## 2011-12-31 ENCOUNTER — Inpatient Hospital Stay (HOSPITAL_COMMUNITY)
Admission: AD | Admit: 2011-12-31 | Discharge: 2012-01-04 | DRG: 765 | Disposition: A | Discharge status: Home / Self Care | Payer: Medicaid Other | Source: Ambulatory Visit | Attending: Obstetrics and Gynecology | Admitting: Obstetrics and Gynecology

## 2011-12-31 ENCOUNTER — Ambulatory Visit (HOSPITAL_COMMUNITY): Payer: Medicaid Other | Admitting: Psychiatry

## 2011-12-31 ENCOUNTER — Other Ambulatory Visit: Payer: Self-pay | Admitting: Obstetrics and Gynecology

## 2011-12-31 ENCOUNTER — Encounter (HOSPITAL_COMMUNITY): Payer: Self-pay | Admitting: *Deleted

## 2011-12-31 ENCOUNTER — Inpatient Hospital Stay (HOSPITAL_COMMUNITY): Payer: Medicaid Other | Admitting: Anesthesiology

## 2011-12-31 ENCOUNTER — Inpatient Hospital Stay (HOSPITAL_COMMUNITY): Payer: Medicaid Other

## 2011-12-31 ENCOUNTER — Encounter (HOSPITAL_COMMUNITY): Payer: Self-pay | Admitting: Anesthesiology

## 2011-12-31 DIAGNOSIS — Z98891 History of uterine scar from previous surgery: Secondary | ICD-10-CM

## 2011-12-31 DIAGNOSIS — O1423 HELLP syndrome (HELLP), third trimester: Secondary | ICD-10-CM

## 2011-12-31 DIAGNOSIS — O1414 Severe pre-eclampsia complicating childbirth: Principal | ICD-10-CM | POA: Diagnosis present

## 2011-12-31 DIAGNOSIS — O368990 Maternal care for other specified fetal problems, unspecified trimester, not applicable or unspecified: Secondary | ICD-10-CM | POA: Diagnosis not present

## 2011-12-31 HISTORY — DX: Gestational (pregnancy-induced) hypertension without significant proteinuria, unspecified trimester: O13.9

## 2011-12-31 HISTORY — DX: HELLP syndrome (HELLP), third trimester: O14.23

## 2011-12-31 HISTORY — DX: Myoneural disorder, unspecified: G70.9

## 2011-12-31 HISTORY — DX: Anxiety disorder, unspecified: F41.9

## 2011-12-31 HISTORY — DX: History of uterine scar from previous surgery: Z98.891

## 2011-12-31 LAB — COMPREHENSIVE METABOLIC PANEL
ALT: 61 U/L — ABNORMAL HIGH (ref 0–35)
BUN: 18 mg/dL (ref 6–23)
CO2: 20 mEq/L (ref 19–32)
CO2: 18 mEq/L — ABNORMAL LOW (ref 19–32)
Calcium: 8 mg/dL — ABNORMAL LOW (ref 8.4–10.5)
Calcium: 7.9 mg/dL — ABNORMAL LOW (ref 8.4–10.5)
Chloride: 105 mEq/L (ref 96–112)
Creatinine, Ser: 1.68 mg/dL — ABNORMAL HIGH (ref 0.50–1.10)
GFR calc Af Amer: 50 mL/min — ABNORMAL LOW (ref >90)
GFR calc Af Amer: 51 mL/min — ABNORMAL LOW (ref >90)
GFR calc non Af Amer: 43 mL/min — ABNORMAL LOW (ref >90)
GFR calc non Af Amer: 44 mL/min — ABNORMAL LOW (ref >90)
Glucose, Bld: 64 mg/dL — ABNORMAL LOW (ref 70–99)
Glucose, Bld: 123 mg/dL — ABNORMAL HIGH (ref 70–99)
Sodium: 135 mEq/L (ref 135–145)
Total Bilirubin: 0.4 mg/dL (ref 0.3–1.2)
Total Protein: 5.2 g/dL — ABNORMAL LOW (ref 6.0–8.3)

## 2011-12-31 LAB — RPR: RPR Ser Ql: NONREACTIVE

## 2011-12-31 LAB — CBC
HCT: 39.5 % (ref 36.0–46.0)
Hemoglobin: 13.6 g/dL (ref 12.0–15.0)
Hemoglobin: 14.1 g/dL (ref 12.0–15.0)
MCH: 30.1 pg (ref 26.0–34.0)
MCV: 86.1 fL (ref 78.0–100.0)
MCV: 85.5 fL (ref 78.0–100.0)
RBC: 4.52 MIL/uL (ref 3.87–5.11)
RBC: 4.62 MIL/uL (ref 3.87–5.11)
WBC: 10.3 10*3/uL (ref 4.0–10.5)
WBC: 13.3 10*3/uL — ABNORMAL HIGH (ref 4.0–10.5)

## 2011-12-31 LAB — TYPE AND SCREEN

## 2011-12-31 MED ORDER — PENICILLIN G POTASSIUM 5000000 UNITS IJ SOLR
2.5000 10*6.[IU] | INTRAVENOUS | Status: DC
  Administered 2011-12-31: 2.5 10*6.[IU] via INTRAVENOUS
  Filled 2011-12-31 (×6): qty 2.5

## 2011-12-31 MED ORDER — SODIUM CHLORIDE 0.9 % IV SOLN: 250.0000 mL | INTRAVENOUS | Status: DC | PRN

## 2011-12-31 MED ORDER — LACTATED RINGERS IV SOLN: 500.0000 mL | Freq: Once | INTRAVENOUS | Status: DC

## 2011-12-31 MED ORDER — LACTATED RINGERS IV SOLN: 500.0000 mL | INTRAVENOUS | Status: DC | PRN

## 2011-12-31 MED ORDER — DOCUSATE SODIUM 100 MG PO CAPS: 100.0000 mg | ORAL_CAPSULE | Freq: Every day | ORAL | Status: DC

## 2011-12-31 MED ORDER — CITRIC ACID-SODIUM CITRATE 334-500 MG/5ML PO SOLN
30.0000 mL | ORAL | Status: DC | PRN
  Administered 2012-01-01: 30 mL via ORAL
  Filled 2011-12-31: qty 15

## 2011-12-31 MED ORDER — MAGNESIUM SULFATE 40 G IN LACTATED RINGERS - SIMPLE
2.0000 g/h | INTRAVENOUS | Status: DC
  Administered 2011-12-31: 2 g/h via INTRAVENOUS
  Filled 2011-12-31: qty 500

## 2011-12-31 MED ORDER — FENTANYL 2.5 MCG/ML BUPIVACAINE 1/10 % EPIDURAL INFUSION (WH - ANES)
14.0000 mL/h | INTRAMUSCULAR | Status: DC
  Filled 2011-12-31: qty 60

## 2011-12-31 MED ORDER — LIDOCAINE HCL (PF) 1 % IJ SOLN: 30.0000 mL | INTRAMUSCULAR | Status: DC | PRN

## 2011-12-31 MED ORDER — ALPRAZOLAM 0.25 MG PO TABS: 0.2500 mg | ORAL_TABLET | Freq: Three times a day (TID) | ORAL | Status: DC | PRN

## 2011-12-31 MED ORDER — ONDANSETRON HCL 4 MG/2ML IJ SOLN
4.0000 mg | Freq: Four times a day (QID) | INTRAMUSCULAR | Status: DC | PRN
  Administered 2012-01-01: 4 mg via INTRAVENOUS
  Filled 2011-12-31: qty 2

## 2011-12-31 MED ORDER — DEXTROSE 5 % IV SOLN
5.0000 10*6.[IU] | Freq: Once | INTRAVENOUS | Status: AC
  Administered 2011-12-31: 5 10*6.[IU] via INTRAVENOUS
  Filled 2011-12-31: qty 5

## 2011-12-31 MED ORDER — PHENYLEPHRINE 40 MCG/ML (10ML) SYRINGE FOR IV PUSH (FOR BLOOD PRESSURE SUPPORT): 80.0000 ug | PREFILLED_SYRINGE | INTRAVENOUS | Status: DC | PRN

## 2011-12-31 MED ORDER — TERBUTALINE SULFATE 1 MG/ML IJ SOLN: 0.2500 mg | Freq: Once | INTRAMUSCULAR | Status: AC | PRN

## 2011-12-31 MED ORDER — ACETAMINOPHEN 325 MG PO TABS: 650.0000 mg | ORAL_TABLET | ORAL | Status: DC | PRN

## 2011-12-31 MED ORDER — CALCIUM CARBONATE ANTACID 500 MG PO CHEW: 2.0000 | CHEWABLE_TABLET | ORAL | Status: DC | PRN

## 2011-12-31 MED ORDER — EPHEDRINE 5 MG/ML INJ: 10.0000 mg | INTRAVENOUS | Status: DC | PRN

## 2011-12-31 MED ORDER — DIPHENHYDRAMINE HCL 50 MG/ML IJ SOLN: 12.5000 mg | INTRAMUSCULAR | Status: DC | PRN

## 2011-12-31 MED ORDER — PRENATAL MULTIVITAMIN CH: 1.0000 | ORAL_TABLET | Freq: Every day | ORAL | Status: DC

## 2011-12-31 MED ORDER — BETAMETHASONE SOD PHOS & ACET 6 (3-3) MG/ML IJ SUSP
12.0000 mg | INTRAMUSCULAR | Status: DC
  Administered 2011-12-31: 12 mg via INTRAMUSCULAR
  Filled 2011-12-31 (×2): qty 2

## 2011-12-31 MED ORDER — MAGNESIUM SULFATE BOLUS VIA INFUSION
4.0000 g | Freq: Once | INTRAVENOUS | Status: AC
  Administered 2011-12-31: 4 g via INTRAVENOUS
  Filled 2011-12-31: qty 500

## 2011-12-31 MED ORDER — MISOPROSTOL 25 MCG QUARTER TABLET
25.0000 ug | ORAL_TABLET | ORAL | Status: DC | PRN
  Administered 2011-12-31 (×2): 25 ug via VAGINAL
  Filled 2011-12-31 (×2): qty 0.25

## 2011-12-31 MED ORDER — IBUPROFEN 600 MG PO TABS: 600.0000 mg | ORAL_TABLET | Freq: Four times a day (QID) | ORAL | Status: DC | PRN

## 2011-12-31 MED ORDER — OXYCODONE-ACETAMINOPHEN 5-325 MG PO TABS: 1.0000 | ORAL_TABLET | ORAL | Status: DC | PRN

## 2011-12-31 MED ORDER — OXYTOCIN 40 UNITS IN LACTATED RINGERS INFUSION - SIMPLE MED: 62.5000 mL/h | Freq: Once | INTRAVENOUS | Status: DC

## 2011-12-31 MED ORDER — SODIUM CHLORIDE 0.9 % IJ SOLN: 3.0000 mL | INTRAMUSCULAR | Status: DC | PRN

## 2011-12-31 MED ORDER — FLEET ENEMA 7-19 GM/118ML RE ENEM: 1.0000 | ENEMA | RECTAL | Status: DC | PRN

## 2011-12-31 MED ORDER — PHENYLEPHRINE 40 MCG/ML (10ML) SYRINGE FOR IV PUSH (FOR BLOOD PRESSURE SUPPORT)
80.0000 ug | PREFILLED_SYRINGE | INTRAVENOUS | Status: DC | PRN
  Filled 2011-12-31 (×2): qty 5

## 2011-12-31 MED ORDER — LACTATED RINGERS IV SOLN
INTRAVENOUS | Status: DC
  Administered 2011-12-31: via INTRAVENOUS

## 2011-12-31 MED ORDER — ZOLPIDEM TARTRATE 5 MG PO TABS: 5.0000 mg | ORAL_TABLET | Freq: Every evening | ORAL | Status: DC | PRN

## 2011-12-31 MED ORDER — EPHEDRINE 5 MG/ML INJ
10.0000 mg | INTRAVENOUS | Status: DC | PRN
  Filled 2011-12-31 (×2): qty 4

## 2011-12-31 MED ORDER — SODIUM CHLORIDE 0.9 % IJ SOLN: 3.0000 mL | Freq: Two times a day (BID) | INTRAMUSCULAR | Status: DC

## 2011-12-31 MED ORDER — HYDRALAZINE HCL 20 MG/ML IJ SOLN
5.0000 mg | Freq: Once | INTRAMUSCULAR | Status: AC
  Administered 2011-12-31: 5 mg via INTRAVENOUS
  Filled 2011-12-31: qty 1

## 2011-12-31 MED ORDER — OXYTOCIN 40 UNITS IN LACTATED RINGERS INFUSION - SIMPLE MED
1.0000 m[IU]/min | INTRAVENOUS | Status: DC
  Filled 2011-12-31: qty 1000

## 2011-12-31 MED ORDER — OXYTOCIN BOLUS FROM INFUSION
250.0000 mL | Freq: Once | INTRAVENOUS | Status: DC
  Filled 2011-12-31: qty 500

## 2011-12-31 NOTE — H&P (Signed)
Carla [name] is a 21 y.o. female presenting for elevated BP and proteinuria, HELLP syndrome by labs.  +FM, no LOF, no VB, occ HA, chest pressure, HA, no RUQ pain. Swelling. Maternal Medical History:  Reason for admission: Reason for admission: nausea.  Contractions: Frequency: rare.    Fetal activity: Perceived fetal activity is normal.    Prenatal complications: Hypertension and pre-eclampsia.     OB History    Grav Para Term Preterm Abortions TAB SAB Ect Mult Living   2 1 1       1     G1 10/09 8#3, female VAVD, PIH, G2 present; no pap; no STDs Past Medical History  Diagnosis Date  . Pregnancy induced hypertension   . Anxiety   . Neuromuscular disorder     complaints of leg pain since 6th grade  . HELLP syndrome in third trimester 12/31/2011   Past Surgical History  Procedure Date  . No past surgeries    Family History: ADHD, Arthritis, blood clot, DM, fibroids, MI, HTN, leukemia, CVA Social History:  reports that she has never smoked. She does not have any smokeless tobacco history on file. She reports that she does not drink alcohol or use illicit drugs.   Prenatal Transfer Tool  Maternal Diabetes: No Genetic Screening: Normal Maternal Ultrasounds/Referrals: Normal Fetal Ultrasounds or other Referrals:  None Maternal Substance Abuse:  No Significant Maternal Medications:  None Significant Maternal Lab Results:  None Other Comments:  h/o anxiety, PIH  Review of Systems  Constitutional: Negative.   HENT: Negative.   Eyes: Negative.   Respiratory: Negative.   Cardiovascular: Positive for chest pain.  Gastrointestinal: Positive for nausea, vomiting and abdominal pain.  Genitourinary: Negative.   Musculoskeletal: Negative.   Skin: Negative.   Neurological: Negative.   Psychiatric/Behavioral: Negative.       Height 5\' 5"  (1.651 m), weight 84.823 kg (187 lb), last menstrual period 03/08/2011. Maternal Exam:  Abdomen: Fundal height is appropriate for gestation.     Pelvis: adequate for delivery.      Physical Exam  Constitutional: She is oriented to person, place, and time. She appears well-developed and well-nourished.  HENT:  Head: Normocephalic and atraumatic.  Eyes: Conjunctivae are normal. Pupils are equal, round, and reactive to light.  Neck: Normal range of motion. Neck supple.  Cardiovascular: Normal rate and regular rhythm.   Respiratory: Effort normal and breath sounds normal. No respiratory distress.  GI: Soft. Bowel sounds are normal. There is no tenderness.  Musculoskeletal: Normal range of motion.  Neurological: She is alert and oriented to person, place, and time.  Skin: Skin is warm and dry.  Psychiatric: She has a normal mood and affect. Her behavior is normal.    Prenatal labs: ABO, Rh: --/--/B POS (08/30 1130) Antibody: NEG (08/30 1130) Rubella:  equivocal RPR:   NR HBsAg:   neg HIV:   neg GBS:   unknown glucola WNL, Hgb 14.8/ Plt 269K/ Hgb electro WNL/GC neg/Chl neg/ AFP WNL  Dates cw 7 wk [place] [facility] 11/22 good growth, nl anat/ post plac/ female/ dermoid?  Assessment/Plan: 20yo G2P1001 at 28+ with HELLP for delivery [place] P either vag or LTCS, LFTs 60-70, BP elevated diastolic 105-110, Plt 128 D/w pt r/b/a NICU and Anesthesia aware Mag SO4 and dry cath   BOVARD,Tasheena [place] 12/31/2011, 2:01 PM

## 2011-12-31 NOTE — Anesthesia Preprocedure Evaluation (Signed)
Anesthesia Evaluation  Patient identified by MRN, date of birth, ID band Patient awake    Reviewed: Allergy & Precautions, H&P , Patient's Chart, lab work & pertinent test results  Airway Mallampati: II TM Distance: >3 FB Neck ROM: full    Dental  (+) Teeth Intact   Pulmonary  breath sounds clear to auscultation        Cardiovascular hypertension, Rhythm:regular Rate:Normal     Neuro/Psych    GI/Hepatic   Endo/Other    Renal/GU      Musculoskeletal   Abdominal   Peds  Hematology   Anesthesia Other Findings  Leg pain orthopedic in nature. Not neurolgic     Reproductive/Obstetrics (+) Pregnancy                           Anesthesia Physical Anesthesia Plan  ASA: III  Anesthesia Plan: Epidural   Post-op Pain Management:    Induction:   Airway Management Planned:   Additional Equipment:   Intra-op Plan:   Post-operative Plan:   Informed Consent: I have reviewed the patients History and Physical, chart, labs and discussed the procedure including the risks, benefits and alternatives for the proposed anesthesia with the patient or authorized representative who has indicated his/her understanding and acceptance.   Dental Advisory Given  Plan Discussed with:   Anesthesia Plan Comments: (Labs checked- platelets confirmed with RN in room We will plan to place a dry catheter with this pt's Hx of HEELP.  Fetal heart tracing, per RN, reported to be stable enough for sitting procedure. Discussed epidural, and patient consents to the procedure:  included risk of possible headache,backache, failed block, allergic reaction, and nerve injury. This patient was asked if she had any questions or concerns before the procedure started. )        Anesthesia Quick Evaluation

## 2011-12-31 NOTE — Anesthesia Procedure Notes (Signed)
Epidural Patient location during procedure: OB  Preanesthetic Checklist Completed: patient identified, site marked, surgical consent, pre-op evaluation, timeout performed, IV checked, risks and benefits discussed and monitors and equipment checked  Epidural Patient position: sitting Prep: site prepped and draped and DuraPrep Patient monitoring: continuous pulse ox and blood pressure Approach: midline Injection technique: LOR air  Needle:  Needle type: Tuohy  Needle gauge: 17 G Needle length: 9 cm and 9 Needle insertion depth: 7 cm Catheter type: closed end flexible Catheter size: 19 Gauge Catheter at skin depth: 14 cm Test dose: negative  Assessment Events: blood not aspirated, injection not painful, no injection resistance, negative IV test and no paresthesia  Additional Notes Dry catheter placed without difficulty

## 2011-12-31 NOTE — Consult Note (Signed)
Neonatology Consult to Antenatal Patient:  Carla [name] is admitted today at [redacted] weeks GA due to severe PIH and HELLP. She is currently not having active labor. She is getting BMZ and magnesium sulfate and apresoline for BP control. An induction is being started as the baby is vertex.   I spoke with the patient and her brother. We discussed the worst case of delivery in the next 1-2 days, including usual DR management, possible respiratory complications and need for support, IV access, feedings (mother desires breast feeding, which was encouraged), LOS, Mortality and Morbidity, but not long term outcomes (patient was having some pain and was having difficulty concentrating). She did not have any questions at this time. I offered a NICU tour to any interested family members and would be glad to come back if she has more questions later.  Thank you for asking me to see this patient.  Deatra James, MD Neonatologist  Time spent: 20 minutes

## 2011-12-31 NOTE — Progress Notes (Signed)
efm dc'd

## 2012-01-01 ENCOUNTER — Encounter (HOSPITAL_COMMUNITY): Payer: Self-pay | Admitting: Obstetrics and Gynecology

## 2012-01-01 ENCOUNTER — Encounter (HOSPITAL_COMMUNITY)
Admission: AD | Disposition: A | Discharge status: Home / Self Care | Payer: Self-pay | Source: Ambulatory Visit | Attending: Obstetrics and Gynecology

## 2012-01-01 DIAGNOSIS — Z98891 History of uterine scar from previous surgery: Secondary | ICD-10-CM

## 2012-01-01 HISTORY — DX: History of uterine scar from previous surgery: Z98.891

## 2012-01-01 LAB — COMPREHENSIVE METABOLIC PANEL
ALT: 87 U/L — ABNORMAL HIGH (ref 0–35)
ALT: 68 U/L — ABNORMAL HIGH (ref 0–35)
AST: 105 U/L — ABNORMAL HIGH (ref 0–37)
Albumin: 1.6 g/dL — ABNORMAL LOW (ref 3.5–5.2)
Albumin: 1.7 g/dL — ABNORMAL LOW (ref 3.5–5.2)
Alkaline Phosphatase: 140 U/L — ABNORMAL HIGH (ref 39–117)
CO2: 15 mEq/L — ABNORMAL LOW (ref 19–32)
Calcium: 7.4 mg/dL — ABNORMAL LOW (ref 8.4–10.5)
Chloride: 102 mEq/L (ref 96–112)
GFR calc non Af Amer: 44 mL/min — ABNORMAL LOW (ref >90)
Potassium: 4.7 mEq/L (ref 3.5–5.1)
Sodium: 132 mEq/L — ABNORMAL LOW (ref 135–145)
Sodium: 132 mEq/L — ABNORMAL LOW (ref 135–145)
Total Bilirubin: 0.3 mg/dL (ref 0.3–1.2)
Total Protein: 4.8 g/dL — ABNORMAL LOW (ref 6.0–8.3)

## 2012-01-01 LAB — CBC
HCT: 35.4 % — ABNORMAL LOW (ref 36.0–46.0)
Hemoglobin: 12.4 g/dL (ref 12.0–15.0)
MCH: 29.7 pg (ref 26.0–34.0)
MCHC: 35 g/dL (ref 30.0–36.0)
MCHC: 34.9 g/dL (ref 30.0–36.0)
MCV: 84.9 fL (ref 78.0–100.0)
RBC: 4.17 MIL/uL (ref 3.87–5.11)
RDW: 12.9 % (ref 11.5–15.5)

## 2012-01-01 SURGERY — Surgical Case
Anesthesia: Epidural | Site: Abdomen | Wound class: Clean Contaminated

## 2012-01-01 MED ORDER — 0.9 % SODIUM CHLORIDE (POUR BTL) OPTIME
TOPICAL | Status: DC | PRN
  Administered 2012-01-01: 1000 mL

## 2012-01-01 MED ORDER — MEPERIDINE HCL 25 MG/ML IJ SOLN: 6.2500 mg | INTRAMUSCULAR | Status: DC | PRN

## 2012-01-01 MED ORDER — SODIUM BICARBONATE 8.4 % IV SOLN
INTRAVENOUS | Status: DC | PRN
  Administered 2012-01-01: 5 mL via EPIDURAL

## 2012-01-01 MED ORDER — MAGNESIUM SULFATE 40 G IN LACTATED RINGERS - SIMPLE
2.0000 g/h | INTRAVENOUS | Status: DC
  Administered 2012-01-01: 2 g/h via INTRAVENOUS
  Filled 2012-01-01 (×2): qty 500

## 2012-01-01 MED ORDER — ONDANSETRON HCL 4 MG/2ML IJ SOLN: 4.0000 mg | Freq: Three times a day (TID) | INTRAMUSCULAR | Status: DC | PRN

## 2012-01-01 MED ORDER — CEFAZOLIN SODIUM-DEXTROSE 2-3 GM-% IV SOLR
INTRAVENOUS | Status: DC | PRN
  Administered 2012-01-01: 2 g via INTRAVENOUS

## 2012-01-01 MED ORDER — MORPHINE SULFATE 0.5 MG/ML IJ SOLN
INTRAMUSCULAR | Status: AC
Start: 2012-01-01 — End: 2012-01-01
  Filled 2012-01-01: qty 10

## 2012-01-01 MED ORDER — OXYCODONE-ACETAMINOPHEN 5-325 MG PO TABS
1.0000 | ORAL_TABLET | ORAL | Status: DC | PRN
  Administered 2012-01-02: 2 via ORAL
  Administered 2012-01-02 – 2012-01-03 (×3): 1 via ORAL
  Filled 2012-01-01: qty 1
  Filled 2012-01-01: qty 2
  Filled 2012-01-01: qty 1
  Filled 2012-01-01: qty 2

## 2012-01-01 MED ORDER — HYDRALAZINE HCL 20 MG/ML IJ SOLN
INTRAMUSCULAR | Status: AC
  Filled 2012-01-01: qty 1

## 2012-01-01 MED ORDER — PRENATAL MULTIVITAMIN CH
1.0000 | ORAL_TABLET | Freq: Every day | ORAL | Status: DC
  Administered 2012-01-01 – 2012-01-04 (×4): 1 via ORAL
  Filled 2012-01-01 (×4): qty 1

## 2012-01-01 MED ORDER — IBUPROFEN 800 MG PO TABS
800.0000 mg | ORAL_TABLET | Freq: Three times a day (TID) | ORAL | Status: DC
  Administered 2012-01-01 – 2012-01-04 (×8): 800 mg via ORAL
  Filled 2012-01-01 (×9): qty 1

## 2012-01-01 MED ORDER — MENTHOL 3 MG MT LOZG
1.0000 | LOZENGE | OROMUCOSAL | Status: DC | PRN
  Filled 2012-01-01: qty 9

## 2012-01-01 MED ORDER — DIPHENHYDRAMINE HCL 25 MG PO CAPS: 25.0000 mg | ORAL_CAPSULE | Freq: Four times a day (QID) | ORAL | Status: DC | PRN

## 2012-01-01 MED ORDER — DIBUCAINE 1 % RE OINT
1.0000 "application " | TOPICAL_OINTMENT | RECTAL | Status: DC | PRN
  Filled 2012-01-01: qty 28

## 2012-01-01 MED ORDER — BUPIVACAINE HCL (PF) 0.25 % IJ SOLN
INTRAMUSCULAR | Status: DC | PRN
  Administered 2011-12-31 (×2): 4 mL

## 2012-01-01 MED ORDER — NALBUPHINE SYRINGE 5 MG/0.5 ML
INJECTION | INTRAMUSCULAR | Status: AC
  Filled 2012-01-01: qty 0.5

## 2012-01-01 MED ORDER — NALBUPHINE HCL 10 MG/ML IJ SOLN
5.0000 mg | INTRAMUSCULAR | Status: DC | PRN
  Administered 2012-01-01: 5 mg via INTRAVENOUS
  Filled 2012-01-01: qty 1

## 2012-01-01 MED ORDER — SIMETHICONE 80 MG PO CHEW
80.0000 mg | CHEWABLE_TABLET | Freq: Three times a day (TID) | ORAL | Status: DC
  Administered 2012-01-01 – 2012-01-04 (×9): 80 mg via ORAL

## 2012-01-01 MED ORDER — LACTATED RINGERS IV SOLN
INTRAVENOUS | Status: DC | PRN
  Administered 2012-01-01: 01:00:00 via INTRAVENOUS

## 2012-01-01 MED ORDER — DIPHENHYDRAMINE HCL 50 MG/ML IJ SOLN: 25.0000 mg | INTRAMUSCULAR | Status: DC | PRN

## 2012-01-01 MED ORDER — OXYTOCIN 10 UNIT/ML IJ SOLN
INTRAMUSCULAR | Status: AC
  Filled 2012-01-01: qty 4

## 2012-01-01 MED ORDER — DIPHENHYDRAMINE HCL 50 MG/ML IJ SOLN
12.5000 mg | INTRAMUSCULAR | Status: DC | PRN
  Administered 2012-01-01: 12.5 mg via INTRAVENOUS

## 2012-01-01 MED ORDER — MEPERIDINE HCL 25 MG/ML IJ SOLN
INTRAMUSCULAR | Status: DC | PRN
  Administered 2012-01-01: 25 mg via INTRAVENOUS

## 2012-01-01 MED ORDER — TETANUS-DIPHTH-ACELL PERTUSSIS 5-2.5-18.5 LF-MCG/0.5 IM SUSP
0.5000 mL | Freq: Once | INTRAMUSCULAR | Status: AC
  Administered 2012-01-02: 0.5 mL via INTRAMUSCULAR
  Filled 2012-01-01: qty 0.5

## 2012-01-01 MED ORDER — OXYTOCIN 10 UNIT/ML IJ SOLN
40.0000 [IU] | INTRAVENOUS | Status: DC | PRN
  Administered 2012-01-01: 40 [IU] via INTRAVENOUS

## 2012-01-01 MED ORDER — HYDRALAZINE HCL 20 MG/ML IJ SOLN
5.0000 mg | Freq: Once | INTRAMUSCULAR | Status: AC
  Administered 2012-01-01: 5 mg via INTRAVENOUS

## 2012-01-01 MED ORDER — ZOLPIDEM TARTRATE 5 MG PO TABS: 5.0000 mg | ORAL_TABLET | Freq: Every evening | ORAL | Status: DC | PRN

## 2012-01-01 MED ORDER — SENNOSIDES-DOCUSATE SODIUM 8.6-50 MG PO TABS
2.0000 | ORAL_TABLET | Freq: Every day | ORAL | Status: DC
  Administered 2012-01-01 – 2012-01-03 (×3): 2 via ORAL

## 2012-01-01 MED ORDER — MAGNESIUM SULFATE 40 G IN LACTATED RINGERS - SIMPLE
2.0000 g/h | INTRAVENOUS | Status: DC
  Filled 2012-01-01: qty 500

## 2012-01-01 MED ORDER — FENTANYL CITRATE 0.05 MG/ML IJ SOLN: 25.0000 ug | INTRAMUSCULAR | Status: DC | PRN

## 2012-01-01 MED ORDER — MEPERIDINE HCL 25 MG/ML IJ SOLN
INTRAMUSCULAR | Status: AC
  Filled 2012-01-01: qty 1

## 2012-01-01 MED ORDER — SCOPOLAMINE 1 MG/3DAYS TD PT72
1.0000 | MEDICATED_PATCH | Freq: Once | TRANSDERMAL | Status: AC
  Administered 2012-01-01: 1.5 mg via TRANSDERMAL

## 2012-01-01 MED ORDER — CEFAZOLIN SODIUM-DEXTROSE 2-3 GM-% IV SOLR
INTRAVENOUS | Status: AC
Start: 2012-01-01 — End: 2012-01-01
  Filled 2012-01-01: qty 50

## 2012-01-01 MED ORDER — ONDANSETRON HCL 4 MG/2ML IJ SOLN: 4.0000 mg | INTRAMUSCULAR | Status: DC | PRN

## 2012-01-01 MED ORDER — MAGNESIUM SULFATE BOLUS VIA INFUSION
4.0000 g | Freq: Once | INTRAVENOUS | Status: AC
  Administered 2012-01-01: 4 g via INTRAVENOUS

## 2012-01-01 MED ORDER — DEXTROSE 5 % IV SOLN
2.0000 g | Freq: Two times a day (BID) | INTRAVENOUS | Status: AC
  Administered 2012-01-01 (×2): 2 g via INTRAVENOUS
  Filled 2012-01-01 (×2): qty 2

## 2012-01-01 MED ORDER — SIMETHICONE 80 MG PO CHEW: 80.0000 mg | CHEWABLE_TABLET | ORAL | Status: DC | PRN

## 2012-01-01 MED ORDER — METOCLOPRAMIDE HCL 5 MG/ML IJ SOLN: 10.0000 mg | Freq: Three times a day (TID) | INTRAMUSCULAR | Status: DC | PRN

## 2012-01-01 MED ORDER — ONDANSETRON HCL 4 MG PO TABS: 4.0000 mg | ORAL_TABLET | ORAL | Status: DC | PRN

## 2012-01-01 MED ORDER — LANOLIN HYDROUS EX OINT: 1.0000 "application " | TOPICAL_OINTMENT | CUTANEOUS | Status: DC | PRN

## 2012-01-01 MED ORDER — SODIUM CHLORIDE 0.9 % IV SOLN
1.0000 ug/kg/h | INTRAVENOUS | Status: DC | PRN
  Filled 2012-01-01: qty 2.5

## 2012-01-01 MED ORDER — NALBUPHINE HCL 10 MG/ML IJ SOLN
5.0000 mg | INTRAMUSCULAR | Status: DC | PRN
  Filled 2012-01-01: qty 1

## 2012-01-01 MED ORDER — DIPHENHYDRAMINE HCL 50 MG/ML IJ SOLN
INTRAMUSCULAR | Status: AC
  Filled 2012-01-01: qty 1

## 2012-01-01 MED ORDER — DIPHENHYDRAMINE HCL 25 MG PO CAPS
25.0000 mg | ORAL_CAPSULE | ORAL | Status: DC | PRN
  Filled 2012-01-01: qty 1

## 2012-01-01 MED ORDER — OXYTOCIN 40 UNITS IN LACTATED RINGERS INFUSION - SIMPLE MED: 62.5000 mL/h | INTRAVENOUS | Status: DC

## 2012-01-01 MED ORDER — SCOPOLAMINE 1 MG/3DAYS TD PT72
MEDICATED_PATCH | TRANSDERMAL | Status: AC
  Filled 2012-01-01: qty 1

## 2012-01-01 MED ORDER — WITCH HAZEL-GLYCERIN EX PADS: 1.0000 "application " | MEDICATED_PAD | CUTANEOUS | Status: DC | PRN

## 2012-01-01 MED ORDER — FENTANYL 2.5 MCG/ML BUPIVACAINE 1/10 % EPIDURAL INFUSION (WH - ANES)
INTRAMUSCULAR | Status: DC | PRN
  Administered 2011-12-31: 14 mL/h via EPIDURAL

## 2012-01-01 MED ORDER — MORPHINE SULFATE (PF) 0.5 MG/ML IJ SOLN
INTRAMUSCULAR | Status: DC | PRN
  Administered 2012-01-01: 4 mg via EPIDURAL
  Administered 2012-01-01: 1 mg via EPIDURAL

## 2012-01-01 MED ORDER — LACTATED RINGERS IV SOLN
INTRAVENOUS | Status: DC
  Administered 2012-01-01 (×2): via INTRAVENOUS

## 2012-01-01 SURGICAL SUPPLY — 36 items
APL SKNCLS STERI-STRIP NONHPOA (GAUZE/BANDAGES/DRESSINGS) ×1
BENZOIN TINCTURE PRP APPL 2/3 (GAUZE/BANDAGES/DRESSINGS) ×1 IMPLANT
CHLORAPREP W/TINT 26ML (MISCELLANEOUS) ×1 IMPLANT
CLOTH BEACON ORANGE TIMEOUT ST (SAFETY) ×2 IMPLANT
CONTAINER PREFILL 10% NBF 15ML (MISCELLANEOUS) ×1 IMPLANT
DRSG COVADERM 4X10 (GAUZE/BANDAGES/DRESSINGS) ×1 IMPLANT
ELECT REM PT RETURN 9FT ADLT (ELECTROSURGICAL) ×2
ELECTRODE REM PT RTRN 9FT ADLT (ELECTROSURGICAL) ×1 IMPLANT
EXTRACTOR VACUUM M CUP 4 TUBE (SUCTIONS) IMPLANT
GLOVE BIO SURGEON STRL SZ 6.5 (GLOVE) ×2 IMPLANT
GLOVE BIO SURGEON STRL SZ7 (GLOVE) ×2 IMPLANT
GOWN PREVENTION PLUS LG XLONG (DISPOSABLE) ×6 IMPLANT
KIT ABG SYR 3ML LUER SLIP (SYRINGE) ×1 IMPLANT
NEEDLE HYPO 25X5/8 SAFETYGLIDE (NEEDLE) ×2 IMPLANT
NS IRRIG 1000ML POUR BTL (IV SOLUTION) ×2 IMPLANT
PACK C SECTION WH (CUSTOM PROCEDURE TRAY) ×2 IMPLANT
PAD ABD 7.5X8 STRL (GAUZE/BANDAGES/DRESSINGS) ×1 IMPLANT
PAD OB MATERNITY 4.3X12.25 (PERSONAL CARE ITEMS) IMPLANT
RTRCTR C-SECT PINK 25CM LRG (MISCELLANEOUS) ×2 IMPLANT
SLEEVE SCD COMPRESS KNEE MED (MISCELLANEOUS) ×1 IMPLANT
STAPLER VISISTAT 35W (STAPLE) IMPLANT
STRIP CLOSURE SKIN 1/2X4 (GAUZE/BANDAGES/DRESSINGS) ×2 IMPLANT
SUT MNCRL 0 VIOLET CTX 36 (SUTURE) ×2 IMPLANT
SUT MONOCRYL 0 CTX 36 (SUTURE) ×2
SUT PLAIN 1 NONE 54 (SUTURE) IMPLANT
SUT PLAIN 2 0 XLH (SUTURE) ×3 IMPLANT
SUT VIC AB 0 CT1 27 (SUTURE) ×4
SUT VIC AB 0 CT1 27XBRD ANBCTR (SUTURE) ×2 IMPLANT
SUT VIC AB 2-0 CT1 27 (SUTURE) ×2
SUT VIC AB 2-0 CT1 TAPERPNT 27 (SUTURE) ×1 IMPLANT
SUT VIC AB 3-0 FS2 27 (SUTURE) ×3 IMPLANT
SYR BULB IRRIGATION 50ML (SYRINGE) ×1 IMPLANT
TAPE CLOTH SURG 4X10 WHT LF (GAUZE/BANDAGES/DRESSINGS) ×1 IMPLANT
TOWEL OR 17X24 6PK STRL BLUE (TOWEL DISPOSABLE) ×4 IMPLANT
TRAY FOLEY CATH 14FR (SET/KITS/TRAYS/PACK) ×1 IMPLANT
WATER STERILE IRR 1000ML POUR (IV SOLUTION) ×2 IMPLANT

## 2012-01-01 NOTE — Brief Op Note (Signed)
12/31/2011 - 01/01/2012  1:56 AM  PATIENT:  Faustino Congress  21 y.o. female  PRE-OPERATIVE DIAGNOSIS:  fetal intolerance to labor, HELLP syndrome  POST-OPERATIVE DIAGNOSIS:  fetal intolerance to labor, HELLP syndrome  PROCEDURE:  Procedure(s) (LRB): CESAREAN SECTION (N/A)  SURGEON:  Surgeon(s) and Role:    * Sherron Monday, MD - Primary  ANESTHESIA:   epidural  EBL:  Total I/O In: 1040 [P.O.:240; I.V.:800] Out: 1725 [Urine:825; Emesis/NG output:300; Blood:600]  FINDINGS: viable female infant at 12:51, apgars 2/6/7, wt 2#8oz, nl uterus, tubes, R ovary, L ovary with cyst  BLOOD ADMINISTERED:none  DRAINS: Urinary Catheter (Foley)   LOCAL MEDICATIONS USED:  NONE  SPECIMEN:  Source of Specimen:  Placenta, ovarian cyst L  DISPOSITION OF SPECIMEN:  PATHOLOGY  COUNTS:  YES  TOURNIQUET:  * No tourniquets in log *  DICTATION: .Other Dictation: Dictation Number [id number]  PLAN OF CARE: Admit to inpatient   PATIENT DISPOSITION:  PACU - hemodynamically stable.   Delay start of Pharmacological VTE agent (>24hrs) due to surgical blood loss or risk of bleeding: not applicable

## 2012-01-01 NOTE — Transfer of Care (Signed)
Immediate Anesthesia Transfer of Care Note  Patient: Carla [name]  Procedure(s) Performed: Procedure(s) (LRB): CESAREAN SECTION (N/A)  Patient Location: PACU  Anesthesia Type: Epidural  Level of Consciousness: awake, alert , oriented and patient cooperative  Airway & Oxygen Therapy: Patient Spontanous Breathing  Post-op Assessment: Report given to PACU RN and Post -op Vital signs reviewed and stable  Post vital signs: Reviewed and stable  Complications: No apparent anesthesia complications

## 2012-01-01 NOTE — Anesthesia Postprocedure Evaluation (Signed)
  Anesthesia Post-op Note  Patient: Carla [name]  Procedure(s) Performed: Procedure(s) (LRB): CESAREAN SECTION (N/A)  Patient Location: Mother/Baby  Anesthesia Type: Epidural  Level of Consciousness: awake  Airway and Oxygen Therapy: Patient Spontanous Breathing  Post-op Pain: mild  Post-op Assessment: Patient's Cardiovascular Status Stable and Respiratory Function Stable  Post-op Vital Signs: stable  Complications: No apparent anesthesia complications

## 2012-01-01 NOTE — Progress Notes (Signed)
Dr. Foster at bedside.

## 2012-01-01 NOTE — OR Nursing (Signed)
50 cc blood loss from fundal massage by DLWegner RN

## 2012-01-01 NOTE — Addendum Note (Signed)
Addendum  created 01/01/12 0831 by Renford Dills, CRNA   Modules edited:Notes Section

## 2012-01-01 NOTE — Anesthesia Postprocedure Evaluation (Signed)
  Anesthesia Post-op Note  Patient: Carla [name]  Procedure(s) Performed: Procedure(s) (LRB): CESAREAN SECTION (N/A)  Patient Location: PACU  Anesthesia Type: Epidural  Level of Consciousness: awake, alert  and oriented  Airway and Oxygen Therapy: Patient Spontanous Breathing  Post-op Pain: none  Post-op Assessment: Post-op Vital signs reviewed, Patient's Cardiovascular Status Stable, Respiratory Function Stable, Patent Airway, No signs of Nausea or vomiting, Pain level controlled, No headache and No backache  Post-op Vital Signs: Reviewed and stable  Complications: No apparent anesthesia complications

## 2012-01-01 NOTE — Progress Notes (Signed)
Subjective: Postpartum Day 1: Cesarean Delivery Patient reports incisional pain and tolerating PO.  Pain controlled, nl lochia  Objective: Vital signs in last 24 hours: Temp:  [97.1 F (36.2 C)-98.4 F (36.9 C)] 97.7 F (36.5 C) (08/31 0801) Pulse Rate:  [59-121] 90  (08/31 0801) Resp:  [10-28] 20  (08/31 0801) BP: (114-183)/(67-135) 144/95 mmHg (08/31 0801) SpO2:  [96 %-100 %] 98 % (08/31 0542) Weight:  [84.823 kg (187 lb)] 84.823 kg (187 lb) (08/30 1121)  Physical Exam:  General: alert and no distress Lochia: appropriate Uterine Fundus: firm Incision: healing well DVT Evaluation: No evidence of DVT seen on physical exam.   Basename 01/01/12 0227 12/31/11 2200  HGB 12.4 14.1  HCT 35.4* 39.5  Plt 108 continuing to fall, LFTs 90-100's  Assessment/Plan: Status post Cesarean section. Doing well postoperatively.  Continue current care.  Recheck CBC and CMP in AM and this PM  BOVARD,Dub Maclellan 01/01/2012, 8:21 AM

## 2012-01-01 NOTE — Op Note (Signed)
NAMESHAVONTE, ZHAO            ACCOUNT NO.:  [id number]  MEDICAL RECORD NO.:  [id number]  LOCATION:  9149                          FACILITY:  [facility]  PHYSICIAN:  Sherron Monday, MD        DATE OF BIRTH:  [birth year]  DATE OF PROCEDURE:  01/01/2012 DATE OF DISCHARGE:                              OPERATIVE REPORT   PREOPERATIVE DIAGNOSES:  Intrauterine pregnancy at 28+ weeks, HELLP syndrome, fetal distress.  POSTOPERATIVE DIAGNOSES:  Intrauterine pregnancy at 28+ weeks, HELLP syndrome, fetal distress, delivered.  PROCEDURE:  Primary low transverse cesarean section.  SURGEON:  Sherron Monday, MD  ANESTHESIA:  Epidural.  EBL:  600 mL.  IV FLUIDS:  1040 cc  urine output, 125 mL clear urine at the end of the procedure.  FINDINGS:  Viable female infant at 12:51 a.m. with Apgars of 2, 6 and 7 and a weight of 2 pounds 8 ounces.  Normal uterus, tubes, and right ovary noted.  Left ovary with cyst that ruptured during the case. PH = 7.04  COMPLICATIONS:  None.  PATHOLOGY:  Placenta and left ovarian cyst wall.  PROCEDURE:  Fetus had an extended brady to 70's, som e recovery to low 100s, but with continued decels, decision to proceed with delivery urgently by LTCS.  After informed consent was reviewed with the patient, she was transported to the OR.  In  course of transfer, her epidural had been re-dosed.  She was placed on the table in supine position with a leftward tilt, prepped and draped in a sterile fashion.  A Pfannenstiel skin incision was made at the level approximately 2 fingerbreadths above the pubic symphysis, carried through the underlying layer of fascia sharply.  The fascial incision was extended manually, and peritoneum was entered bluntly.  Alexis skin retractor was placed carefully checking to make sure no bowel was entrapped.  The uterus was incised in a transverse fashion after verification of the bladder's location.  Infant was delivered from an OP vertex presentation,  was passed to the awaiting NICU staff.  Placenta was expressed.  Some clot was noted secondary to Likely abruption.  The placenta was sent to pathology.  The uterus was cleared of all clot and debris.  The incision was closed in 2 layers with 0 Monocryl the first of which is a running locked and the second is an imbricating.  This was noted to be hemostatic.  With manipulation of the uterus, a cyst was noted on the right ovary which was ruptured for a foul-smelling yellowish liquid.  The cyst wall was then removed, and some tissue was also sent to pathology that was inside the cyst wall.  The ovary was made hemostatic and closed with 3-0 Vicryl in a running locked fashion.  Copious pelvic irrigation was performed after the uterus had been replaced to the abdomen.  The peritoneum was reapproximated using 2-0 Vicryl in a running fashion. Fascia was closed with 0 Vicryl in a single suture after the subfascial planes were inspected and found to be hemostatic.  Subcuticular adipose layer was made hemostatic with Bovie cautery, also irrigated.  The dead space was closed with 3-0 plain gut.  The skin was closed with 3-0  Vicryl in a subcuticular fashion.  Benzoin and Steri- Strips were applied.  The patient tolerated the procedure well.  Sponge, lap, and needle counts were correct x2 at the end of the procedure per the operating room staff.     Sherron Monday, MD     JB/MEDQ  D:  01/01/2012  T:  01/01/2012  Job:  [id number]

## 2012-01-02 DIAGNOSIS — M7989 Other specified soft tissue disorders: Secondary | ICD-10-CM

## 2012-01-02 LAB — COMPREHENSIVE METABOLIC PANEL
Alkaline Phosphatase: 127 U/L — ABNORMAL HIGH (ref 39–117)
BUN: 19 mg/dL (ref 6–23)
Calcium: 6.5 mg/dL — ABNORMAL LOW (ref 8.4–10.5)
GFR calc Af Amer: 51 mL/min — ABNORMAL LOW (ref >90)
GFR calc non Af Amer: 44 mL/min — ABNORMAL LOW (ref >90)
Glucose, Bld: 104 mg/dL — ABNORMAL HIGH (ref 70–99)
Potassium: 4.4 mEq/L (ref 3.5–5.1)
Total Protein: 4.6 g/dL — ABNORMAL LOW (ref 6.0–8.3)

## 2012-01-02 LAB — CBC
HCT: 36.6 % (ref 36.0–46.0)
Hemoglobin: 12.7 g/dL (ref 12.0–15.0)
MCH: 29.7 pg (ref 26.0–34.0)
MCHC: 34.7 g/dL (ref 30.0–36.0)

## 2012-01-02 MED ORDER — LABETALOL HCL 200 MG PO TABS
200.0000 mg | ORAL_TABLET | Freq: Two times a day (BID) | ORAL | Status: DC
  Administered 2012-01-02 – 2012-01-04 (×4): 200 mg via ORAL
  Filled 2012-01-02 (×6): qty 1

## 2012-01-02 MED ORDER — LABETALOL HCL 5 MG/ML IV SOLN
20.0000 mg | Freq: Once | INTRAVENOUS | Status: AC
  Administered 2012-01-02: 20 mg via INTRAVENOUS
  Filled 2012-01-02: qty 4

## 2012-01-02 MED ORDER — FUROSEMIDE 10 MG/ML IJ SOLN
20.0000 mg | Freq: Once | INTRAMUSCULAR | Status: AC
  Administered 2012-01-02: 20 mg via INTRAVENOUS
  Filled 2012-01-02: qty 2

## 2012-01-02 MED ORDER — LABETALOL HCL 100 MG PO TABS
100.0000 mg | ORAL_TABLET | Freq: Two times a day (BID) | ORAL | Status: DC
  Administered 2012-01-02: 100 mg via ORAL
  Filled 2012-01-02 (×3): qty 1

## 2012-01-02 NOTE — Progress Notes (Signed)
CSW aware of consult.  Will attempt to see pt for assessment today, or possibility of weekday CSW completing consult.

## 2012-01-02 NOTE — Progress Notes (Addendum)
Subjective: Postpartum Day 1: Cesarean Delivery Patient reports incisional pain, tolerating PO and no problems voiding.  Nl lochia, pain controlled.  Feel better of magnesium.  No HA, blurry vision, RUQ pain.  Objective: Vital signs in last 24 hours: Temp:  [97.4 F (36.3 C)-98.2 F (36.8 C)] 97.7 F (36.5 C) (09/01 0400) Pulse Rate:  [58-84] 71  (09/01 0700) Resp:  [16-20] 16  (09/01 0550) BP: (129-172)/(72-110) 146/97 mmHg (09/01 0800) SpO2:  [94 %-100 %] 100 % (09/01 0700) Weight:  [84.324 kg (185 lb 14.4 oz)] 84.324 kg (185 lb 14.4 oz) (09/01 0600)  Physical Exam:  General: alert and no distress Lochia: appropriate Uterine Fundus: firm Incision: healing well DVT Evaluation: No evidence of DVT seen on physical exam.   Basename 01/02/12 0550 01/01/12 1743  HGB 12.7 13.0  HCT 36.6 37.2    Assessment/Plan: Status post Cesarean section. Doing well postoperatively.  Continue current care - transfer to MB - routine care. Started labetalol 100 bid this AM  BOVARD,Jaydin Jalomo 01/02/2012, 9:07 AM

## 2012-01-02 NOTE — Progress Notes (Signed)
VASCULAR LAB PRELIMINARY  PRELIMINARY  PRELIMINARY  PRELIMINARY  Left upper extremity venous Doppler completed.    Preliminary report:  There is no DVT or SVT noted in the left upper extremity.  Carla [name], 01/02/2012, 7:48 PM

## 2012-01-02 NOTE — Progress Notes (Signed)
Pt B/P  171/105 no c/o H/A ,BV or epigastric pain.DTR +2 no clonus.  Left arm swollen, warm to touch and red. No c/o pain in the left arm just difficult to bend. Orders given to give Labetalol 20mg  IVP x1 now. Increase Labetalol 200mg PO  bid. Order a doppler study on the left arm to r/o DVT. Will continue to monitor.

## 2012-01-02 NOTE — Progress Notes (Signed)
Pt left arm warm to touch and +2 edema. NSL D/C .Pt states is doesn't hurt its just hard to bend.  Left arm elevated on pillows and ice pack placed. Will continue to monitor.

## 2012-01-02 NOTE — Progress Notes (Signed)
[facility] Coverage assessed pt swelling in left and right arm recommended to d/c doppler study and get an order for Lasix.  Dr. Ellyn Hack notified orders given to d/c doppler and given Lasix 20mg  IV x1 .  Will continue to monitor.

## 2012-01-03 ENCOUNTER — Encounter (HOSPITAL_COMMUNITY): Payer: Self-pay | Admitting: Obstetrics and Gynecology

## 2012-01-03 LAB — CBC
HCT: 33.5 % — ABNORMAL LOW (ref 36.0–46.0)
MCV: 86.8 fL (ref 78.0–100.0)
Platelets: 109 10*3/uL — ABNORMAL LOW (ref 150–400)
RBC: 3.86 MIL/uL — ABNORMAL LOW (ref 3.87–5.11)
WBC: 14 10*3/uL — ABNORMAL HIGH (ref 4.0–10.5)

## 2012-01-03 LAB — COMPREHENSIVE METABOLIC PANEL
AST: 56 U/L — ABNORMAL HIGH (ref 0–37)
Alkaline Phosphatase: 110 U/L (ref 39–117)
CO2: 21 mEq/L (ref 19–32)
Chloride: 105 mEq/L (ref 96–112)
Creatinine, Ser: 1.65 mg/dL — ABNORMAL HIGH (ref 0.50–1.10)
GFR calc non Af Amer: 44 mL/min — ABNORMAL LOW (ref >90)
Potassium: 4.1 mEq/L (ref 3.5–5.1)
Total Bilirubin: 0.2 mg/dL — ABNORMAL LOW (ref 0.3–1.2)

## 2012-01-03 MED ORDER — MEASLES, MUMPS & RUBELLA VAC ~~LOC~~ INJ
0.5000 mL | INJECTION | Freq: Once | SUBCUTANEOUS | Status: DC
  Filled 2012-01-03: qty 0.5

## 2012-01-03 NOTE — Progress Notes (Signed)
Ur chart review completed.  

## 2012-01-03 NOTE — Progress Notes (Signed)
POD #2 LTCS, HELLP Feels ok, still some swelling and discomfort in left arm Afeb, VSS, BP 140-170/90-110 Abd- soft, fundus firm, incision intact Labs essentially stable, LFTs slightly elevated, platelets down a bit Will continue current Labetalol, continue current care, recheck labs in am.

## 2012-01-04 LAB — CBC
MCH: 29.7 pg (ref 26.0–34.0)
MCHC: 33.9 g/dL (ref 30.0–36.0)
Platelets: 125 10*3/uL — ABNORMAL LOW (ref 150–400)
RBC: 3.9 MIL/uL (ref 3.87–5.11)
RDW: 13.6 % (ref 11.5–15.5)

## 2012-01-04 LAB — COMPREHENSIVE METABOLIC PANEL
ALT: 72 U/L — ABNORMAL HIGH (ref 0–35)
AST: 73 U/L — ABNORMAL HIGH (ref 0–37)
Albumin: 1.7 g/dL — ABNORMAL LOW (ref 3.5–5.2)
CO2: 21 mEq/L (ref 19–32)
Calcium: 7.4 mg/dL — ABNORMAL LOW (ref 8.4–10.5)
Creatinine, Ser: 1.24 mg/dL — ABNORMAL HIGH (ref 0.50–1.10)
Sodium: 136 mEq/L (ref 135–145)
Total Protein: 4.7 g/dL — ABNORMAL LOW (ref 6.0–8.3)

## 2012-01-04 MED ORDER — LABETALOL HCL 200 MG PO TABS: 200.0000 mg | ORAL_TABLET | Freq: Two times a day (BID) | ORAL | Status: DC

## 2012-01-04 MED ORDER — IBUPROFEN 800 MG PO TABS: 800.0000 mg | ORAL_TABLET | Freq: Three times a day (TID) | ORAL | Status: AC

## 2012-01-04 MED ORDER — PRENATAL MULTIVITAMIN CH: 1.0000 | ORAL_TABLET | Freq: Every day | ORAL | Status: DC

## 2012-01-04 MED ORDER — OXYCODONE-ACETAMINOPHEN 5-325 MG PO TABS: 1.0000 | ORAL_TABLET | Freq: Four times a day (QID) | ORAL | Status: AC | PRN

## 2012-01-04 NOTE — Progress Notes (Addendum)
Subjective: Postpartum Day 3: Cesarean Delivery Patient reports incisional pain and tolerating PO.  Nl lochia, pain controlled  Objective: Vital signs in last 24 hours: Temp:  [98.2 F (36.8 C)-99 F (37.2 C)] 98.4 F (36.9 C) (09/03 4540) Pulse Rate:  [73-105] 105  (09/03 0633) Resp:  [18] 18  (09/03 0633) BP: (148-161)/(85-102) 152/102 mmHg (09/03 0633) SpO2:  [99 %-100 %] 100 % (09/03 9811)  Physical Exam:  General: alert and no distress Lochia: appropriate Uterine Fundus: firm Incision: healing well DVT Evaluation: No evidence of DVT seen on physical exam.   Basename 01/04/12 0620 01/03/12 0530  HGB 11.6* 11.5*  HCT 34.2* 33.5*    Assessment/Plan: Status post Cesarean section. Doing well postoperatively.  Discharge home with standard precautions and return to clinic in 2 weeks.  D/c with motrin/percocet/pnv.  Baby in NICU - on abx, vent  Plts back up, LFTs elevated slightly, will follow in office  BOVARD,Felis Quillin 01/04/2012, 8:53 AM

## 2012-01-04 NOTE — Clinical Social Work Maternal (Signed)
Clinical Social Work Department PSYCHOSOCIAL ASSESSMENT - MATERNAL/CHILD Late Entry 01/03/2012  Patient:  Carla [name], Carla [name]  Account Number:  [id number]  Admit Date:  12/31/2011  Marjo Bicker Name:   Helaine Chess    Clinical Social Worker:  Lulu Riding, LCSW   Date/Time:  01/03/2012 01:00 PM  Date Referred:  01/03/2012   Referral source  NICU     Referred reason  NICU   Other referral source:    I:  FAMILY / HOME ENVIRONMENT Child's legal guardian:  PARENT  Guardian - Name Guardian - Age Guardian - Address  [name] [address] [place] [place], [place] [address]  [name] 22 same   Other household support members/support persons Name Relationship DOB   GRAND MOTHER   Quel UNCLE 16  Javon UNCLE 7  [place] AUNT 47   OTHER 1  Young Berry 02/28/08   Other support:    II  PSYCHOSOCIAL DATA Information Source:  Patient Interview  Event organiser Employment:   FOB works at [employer] resources:  [employer] If [employer]:  [employer] Other  Sales executive  WIC   School / Grade:   Maternity Care Coordinator / Child Services Coordination / Early Interventions:  Cultural issues impacting care:   none known    III  STRENGTHS Strengths  Adequate Resources  Compliance with medical plan  Other - See comment  Supportive family/friends  Understanding of illness   Strength comment:  Pediatric follow up will be at [facility]   IV  RISK FACTORS AND CURRENT PROBLEMS Current Problem:  YES   Risk Factor & Current Problem Patient Issue Family Issue Risk Factor / Current Problem Comment  Mental Illness Y N MOB hx of Anxiety and Panic Attacks   N N     V  SOCIAL WORK ASSESSMENT SW met with MOB in her third floor room/318 to introduce myself, complete assessment and evaluate how she is coping with baby's admission to NICU.  MOB was very pleasant and welcomed SW into her room.  She  states that this is her first experience having a premature baby, but thinks she is handling the situation well.  She states she is doing better now that she feels much better physically and is out of AICU.  She seems to have a good understanding of baby's medical situation and states that she has a good support system.  Her boyfriend is the father of her almost 20 year old and states that he lives with them and is involved and supportive.  She states that they do not have all baby supplies yet, but are working on it.  She states they do have a bed, however.  SW asked her to let SW know if she has needs and SW will make a referral to [employer] if needed.  SW informed MOB of support services offered by NICU SW and gave contac information.  SW discussed what to expect from a NICU admission in general terms and common emotions related to the NICU experience. SW discussed signs and symptoms of PPD and asked MOB about her hx of anxiety.  She denies any hx of depression and states anxiety in the past, but no symptoms currently.  She states she feels comfortable talking with her doctor if PPD symptoms arise.  SW gave Feelings After Birth handout.  MOB states no questions or needs at this time.  She was very Adult nurse and  told SW that she will call if she has any needs of questions in the future.      VI SOCIAL WORK PLAN Social Work Plan  Psychosocial Support/Ongoing Assessment of Needs   Type of pt/family education:   What to expect in NICU  PPD/Common emotions related to NICU admission   If child protective services report - county:   If child protective services report - date:   Information/referral to community resources comment:   PPD suuport information if needed.   Other social work plan:

## 2012-01-04 NOTE — Discharge Summary (Addendum)
Obstetric Discharge Summary Reason for Admission: HELLP syndrome Prenatal Procedures: NST, Preeclampsia, ultrasound and labs Intrapartum Procedures: cesarean: low cervical, transverse and GBS prophylaxis Postpartum Procedures: none Complications-Operative and Postpartum: none Hemoglobin  Date Value Range Status  01/04/2012 11.6* 12.0 - 15.0 g/dL Final     HCT  Date Value Range Status  01/04/2012 34.2* 36.0 - 46.0 % Final    Physical Exam:  General: alert and no distress Lochia: appropriate Uterine Fundus: firm Incision: healing well DVT Evaluation: No evidence of DVT seen on physical exam.  Discharge Diagnoses: Preterm delivery  Discharge Information: Date: 01/04/2012 Activity: pelvic rest Diet: routine Medications: PNV, Ibuprofen, Percocet and Labetalol Condition: stable Instructions: refer to practice specific booklet Discharge to: home Follow-up Information    Follow up with Carla [name],Charlann Wayne, MD. Schedule an appointment as soon as possible for a visit in 2 weeks. (Also come in for labs next week!)    Contact information:   [address] [place] [address] [phone]        come to office for CBC, CMP next week  Newborn Data: Live born female  Birth Weight: 2 lb 8.2 oz (1140 g) APGAR: 2, 6  Baby in NICU  Carla [name],Carla [name] 01/04/2012, 9:03 AM

## 2012-01-07 ENCOUNTER — Encounter (HOSPITAL_COMMUNITY): Payer: Self-pay | Admitting: *Deleted

## 2012-01-20 ENCOUNTER — Ambulatory Visit: Payer: Medicaid Other

## 2012-01-20 ENCOUNTER — Ambulatory Visit: Payer: Medicaid Other | Attending: Gynecologic Oncology | Admitting: Gynecologic Oncology

## 2012-01-20 ENCOUNTER — Encounter: Payer: Self-pay | Admitting: Gynecologic Oncology

## 2012-01-20 VITALS — BP 102/70 | HR 60 | Temp 97.9°F | Resp 16 | Ht 65.16 in | Wt 158.0 lb

## 2012-01-20 DIAGNOSIS — I10 Essential (primary) hypertension: Secondary | ICD-10-CM | POA: Insufficient documentation

## 2012-01-20 DIAGNOSIS — D3910 Neoplasm of uncertain behavior of unspecified ovary: Secondary | ICD-10-CM | POA: Insufficient documentation

## 2012-01-20 DIAGNOSIS — C569 Malignant neoplasm of unspecified ovary: Secondary | ICD-10-CM

## 2012-01-20 DIAGNOSIS — C561 Malignant neoplasm of right ovary: Secondary | ICD-10-CM | POA: Insufficient documentation

## 2012-01-20 DIAGNOSIS — Z79899 Other long term (current) drug therapy: Secondary | ICD-10-CM | POA: Insufficient documentation

## 2012-01-20 NOTE — Patient Instructions (Addendum)
Options presented:  1. CT scan of the abdomen and pelvis as a baseline study with ultrasounds every 6 months between days 3-10 of her cycle, and followup pelvic examinations every 6 months for the next 5 years. 2 Right salpingo-oophorectomy with omentectomy and followup with pelvic examinations every 6 months for the next 5 years 3. Bilateral salpingo-oophorectomy and omentectomy  Option 1 was recommended to the patient and this is what she feels at this time she will pursue.  Our recommendation is for bilateral salpingo-oophorectomy when she has completed her childbearing.   At your visit with Dr.Bovard 02/11/2012  I strongly recommend that you discuss oral contraceptive pills as opposed to Depo Provera.  Followup in 3 months for rediscussion regarding the options presented.  F/U with Dr. Ellyn Hack if depression worsens.  F/U with Dr. Ellyn Hack to discuss OCP on 02/11/2012.

## 2012-01-20 NOTE — Progress Notes (Signed)
Consult Note: Gyn-Onc  Consult was requested by Dr. Ellyn Hack for the evaluation of Carla [name] 21 y.o. female  CC:  Chief Complaint  Patient presents with  . LMP Tumor    New consult    HPI: 40 G2P2 patient underwent C/S 01/01/2012 for HELLP.  Right ovarian cystectomy performed and was notable for serous LMP.  No family history of gyn malignancy.  Interval History: Patient denies abdominal bloating changes in bowel or rectal habits cough shortness of breath. Review of the operative note is notable for the fact that no peritoneal lesions were appreciated.  The patient is not sure whether or not she wishes to have more children  Current Meds:  Outpatient Encounter Prescriptions as of 01/20/2012  Medication Sig Dispense Refill  . acetaminophen (TYLENOL) 500 MG tablet Take 500 mg by mouth every 6 (six) hours as needed. pain      . labetalol (NORMODYNE) 200 MG tablet Take 1 tablet (200 mg total) by mouth 2 (two) times daily.  60 tablet  1  . Prenatal Vit-Fe Fumarate-FA (PRENATAL MULTIVITAMIN) TABS Take 1 tablet by mouth daily.  30 tablet  1    Allergy: No Known Allergies  Social Hx:   History   Social History  . Marital Status: Single    Spouse Name: N/A    Number of Children: N/A  . Years of Education: N/A   Occupational History  . Not on file.   Social History Main Topics  . Smoking status: Never Smoker   . Smokeless tobacco: Not on file  . Alcohol Use: No  . Drug Use: No  . Sexually Active: Yes   Other Topics Concern  . Not on file   Social History Narrative  . No narrative on file    Past Surgical Hx:  Past Surgical History  Procedure Date  . No past surgeries   . Cesarean section 01/01/2012    Procedure: CESAREAN SECTION;  Surgeon: Sherron Monday, MD;  Location: [facility];  Service: Gynecology;  Laterality: N/A;  Primary cesarean section of baby  boy at 41    Past Medical Hx:  Past Medical History  Diagnosis Date  . Pregnancy induced hypertension   .  Anxiety   . Neuromuscular disorder     complaints of leg pain since 6th grade  . HELLP syndrome in third trimester 12/31/2011  . S/P emergency cesarean section 01/01/2012    Past Gynecological History:   Patient's last menstrual period was 03/08/2011.  Family Hx: No family history on file.  Review of Systems:  Constitutional  Feels well,  Breast feeding every 2 hours.  Feels tired Skin/Breast  No rash, sores,or masses Cardiovascular  No chest pain, shortness of breath, or edema  Pulmonary  No cough or wheeze.  Gastro Intestinal  No nausea, vomitting, or diarrhoea. No bright red blood per rectum, no abdominal pain, change in bowel movement, or constipation.  Genito Urinary  Reports vaginal discharge Musculo Skeletal  Leg cramps with evening stretches. Neurologic  No headache Psychology  Reports depression that she attributes, anxiety, insomnia.   Vitals:  Blood pressure 102/70, pulse 60, temperature 97.9 F (36.6 C), temperature source Oral, resp. rate 16, height 5' 5.16" (1.655 m), weight 158 lb (71.668 kg), last menstrual period 03/08/2011, currently breastfeeding.  Physical Exam: WD in NAD Neck  Supple NROM, without any enlargements.  Lymph Node Survey No cervical supraclavicular or inguinal adenopathy Cardiovascular  Pulse normal rate, regularity and rhythm. S1 and S2 normal.  Lungs  Clear to auscultation bilaterally. Good air movement.  Skin  Acne of the face  Psychiatry  Alert and oriented to person, place, and time  Abdomen  Normoactive bowel sounds, abdomen soft, non-tender Pfannenstiel incision intact without evidence of hernia, cellulitis or discharge.  Back No CVA tenderness Genito Urinary  Vulva/vagina: Normal external female genitalia.  No lesions. No discharge or bleeding.  Bladder/urethra:  No lesions or masses  Vagina: Well estrogenized normal rugae no vaginal discharge or bleeding present Cervix: Approximately 3 cm Normal appearing, no  lesions.  Uterus: Mobile, no parametrial involvement or nodularity.  Adnexa: No palpable masses. Rectal  Deferred  Extremities  No bilateral cyanosis, clubbing or edema.   Assessment/Plan:  Carla [name]  is a 21 y.o.  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 The patient at this time thinks that she may wish to be pregnant one more time to have a daughter.  The options presented to [facility] were as follows. 1. CT scan of the abdomen and pelvis as a baseline study with ultrasounds every 6 months between days 3-10 of her cycle, and followup pelvic examinations every 6 months for the next 5 years. 2 Right salpingo-oophorectomy with omentectomy and followup with pelvic examinations every 6 months for the next 5 years 3. Bilateral salpingo-oophorectomy and omentectomy  Option 1 was recommended to the patient and this is what she feels at this time she will pursue.  The patient is aware that our recommendation is for bilateral salpingo-oophorectomy when she has completed her childbearing. She is aware that there is the possibility of recurrent disease.  If recurrent disease is identified it is generally surgically treated. She also understands that there is a low risk of progression to malignancy and this as reported in the literature among the women who have had a minimum an oophorectomy is less than 5%.  At her visit with Dr.Bovard 02/11/2012  I have strongly recommended that she discuss oral contraceptive pills as opposed to Depo Provera.  I have asked her to followup in 3 months for rediscussion regarding the options presented. F/U with Dr. Ellyn Hack to discuss OCP on 02/11/2012.   Laurette Schimke, MD, PhD 01/20/2012, 9:14 AM

## 2012-02-03 ENCOUNTER — Ambulatory Visit (HOSPITAL_COMMUNITY)
Admission: RE | Admit: 2012-02-03 | Discharge: 2012-02-03 | Disposition: A | Discharge status: Home / Self Care | Payer: Self-pay | Source: Ambulatory Visit | Attending: Gynecologic Oncology | Admitting: Gynecologic Oncology

## 2012-02-03 DIAGNOSIS — C569 Malignant neoplasm of unspecified ovary: Secondary | ICD-10-CM

## 2012-02-03 DIAGNOSIS — N83209 Unspecified ovarian cyst, unspecified side: Secondary | ICD-10-CM | POA: Insufficient documentation

## 2012-02-03 MED ORDER — IOHEXOL 300 MG/ML  SOLN
100.0000 mL | Freq: Once | INTRAMUSCULAR | Status: AC | PRN
  Administered 2012-02-03: 100 mL via INTRAVENOUS

## 2012-04-17 ENCOUNTER — Encounter: Payer: Self-pay | Admitting: Gynecologic Oncology

## 2012-04-17 NOTE — Progress Notes (Signed)
Discussed explained EPP program and guidelines with [name]. Mailed EPP application today and advised to return application with proof of income.

## 2012-04-18 ENCOUNTER — Ambulatory Visit: Payer: Medicaid Other | Admitting: Gynecologic Oncology

## 2012-10-15 ENCOUNTER — Emergency Department (HOSPITAL_COMMUNITY)
Admission: EM | Admit: 2012-10-15 | Discharge: 2012-10-16 | Disposition: A | Discharge status: Home / Self Care | Payer: Medicaid Other | Attending: Emergency Medicine | Admitting: Emergency Medicine

## 2012-10-15 ENCOUNTER — Encounter (HOSPITAL_COMMUNITY): Payer: Self-pay | Admitting: Emergency Medicine

## 2012-10-15 ENCOUNTER — Emergency Department (HOSPITAL_COMMUNITY): Payer: Medicaid Other

## 2012-10-15 DIAGNOSIS — Z8659 Personal history of other mental and behavioral disorders: Secondary | ICD-10-CM | POA: Insufficient documentation

## 2012-10-15 DIAGNOSIS — X500XXA Overexertion from strenuous movement or load, initial encounter: Secondary | ICD-10-CM | POA: Insufficient documentation

## 2012-10-15 DIAGNOSIS — Y9339 Activity, other involving climbing, rappelling and jumping off: Secondary | ICD-10-CM | POA: Insufficient documentation

## 2012-10-15 DIAGNOSIS — S8992XA Unspecified injury of left lower leg, initial encounter: Secondary | ICD-10-CM

## 2012-10-15 DIAGNOSIS — Y92009 Unspecified place in unspecified non-institutional (private) residence as the place of occurrence of the external cause: Secondary | ICD-10-CM | POA: Insufficient documentation

## 2012-10-15 DIAGNOSIS — S8990XA Unspecified injury of unspecified lower leg, initial encounter: Secondary | ICD-10-CM | POA: Insufficient documentation

## 2012-10-15 DIAGNOSIS — Z8669 Personal history of other diseases of the nervous system and sense organs: Secondary | ICD-10-CM | POA: Insufficient documentation

## 2012-10-15 MED ORDER — NAPROXEN 500 MG PO TABS: 500.0000 mg | ORAL_TABLET | Freq: Two times a day (BID) | ORAL | Status: DC

## 2012-10-15 MED ORDER — NAPROXEN 500 MG PO TABS
500.0000 mg | ORAL_TABLET | Freq: Once | ORAL | Status: AC
  Administered 2012-10-15: 500 mg via ORAL
  Filled 2012-10-15: qty 1

## 2012-10-15 NOTE — ED Notes (Signed)
Pt alert, nad, arrives from home, c/o left knee pain, onset was today, states "jumping on the bed", resp even unlabored, skin pwd

## 2012-10-15 NOTE — ED Provider Notes (Signed)
History    This chart was scribed for non-physician practitioner, Carla Crigler, PA-C, working with Carla [name]. Carla Payor, MD by Carla [name], ED Scribe. This patient was seen in room WTR6/WTR6 and the patient's care was started at 10:55PM.  CSN: [id number]  Arrival date & time 10/15/12  2130   None     Chief Complaint  Patient presents with  . Knee Injury    (Consider location/radiation/quality/duration/timing/severity/associated sxs/prior treatment) The history is provided by the patient. No language interpreter was used.   HPI Comments: Carla [name] is a 22 y.o. female who presents to the Emergency Department complaining of constant, moderate to severe left knee pain with a sudden onset this evening about 2 hours ago.   She reports she was jumping on her bed when she injured her knee and bent it irregularly. Walking is very difficult secondary to pain. Walking, standing, and movement of the knee aggravates the pain. She has not tried any pain medications or treatments PTA. No known allergies. No other pertinent medical symptoms.  Past Medical History  Diagnosis Date  . Pregnancy induced hypertension   . Anxiety   . Neuromuscular disorder     complaints of leg pain since 6th grade  . HELLP syndrome in third trimester 12/31/2011  . S/P emergency cesarean section 01/01/2012    Past Surgical History  Procedure Laterality Date  . No past surgeries    . Cesarean section  01/01/2012    Procedure: CESAREAN SECTION;  Surgeon: Sherron Monday, MD;  Location: [facility];  Service: Gynecology;  Laterality: N/A;  Primary cesarean section of baby  boy at 76    No family history on file.  History  Substance Use Topics  . Smoking status: Never Smoker   . Smokeless tobacco: Not on file  . Alcohol Use: No    OB History   Grav Para Term Preterm Abortions TAB SAB Ect Mult Living   2 2 1 1      2       Review of Systems  Constitutional: Negative for appetite change and fatigue.  HENT:  Negative for congestion, sinus pressure and ear discharge.   Eyes: Negative for discharge.  Respiratory: Negative for cough.   Cardiovascular: Negative for chest pain.  Gastrointestinal: Negative for abdominal pain and diarrhea.  Genitourinary: Negative for frequency and hematuria.  Musculoskeletal: Positive for arthralgias. Negative for back pain.  Skin: Negative for rash.  Neurological: Negative for seizures and headaches.  Psychiatric/Behavioral: Negative for hallucinations.  All other systems reviewed and are negative.    Allergies  Review of patient's allergies indicates no known allergies.  Home Medications   Current Outpatient Rx  Name  Route  Sig  Dispense  Refill  . acetaminophen (TYLENOL) 500 MG tablet   Oral   Take 500 mg by mouth every 6 (six) hours as needed. pain         . labetalol (NORMODYNE) 200 MG tablet   Oral   Take 1 tablet (200 mg total) by mouth 2 (two) times daily.   60 tablet   1   . naproxen (NAPROSYN) 500 MG tablet   Oral   Take 1 tablet (500 mg total) by mouth 2 (two) times daily.   20 tablet   0   . Prenatal Vit-Fe Fumarate-FA (PRENATAL MULTIVITAMIN) TABS   Oral   Take 1 tablet by mouth daily.   30 tablet   1     BP 110/77  Pulse 73  Temp(Src) 98 F (  36.7 C)  Resp 16  Ht 5\' 5"  (1.651 m)  Wt 140 lb (63.504 kg)  BMI 23.3 kg/m2  SpO2 99%  LMP 10/08/2012  Physical Exam  Nursing note and vitals reviewed. Constitutional: She appears well-developed.  HENT:  Head: Normocephalic.  Eyes: Conjunctivae are normal.  Neck: No tracheal deviation present.  Cardiovascular:  No murmur heard. Musculoskeletal:       Left hip: Normal.       Left knee: She exhibits decreased range of motion, swelling and effusion (mild). She exhibits no bony tenderness. Tenderness found. Medial joint line, lateral joint line, MCL and LCL tenderness noted. No patellar tendon tenderness noted.       Left ankle: Normal.       Left foot: Normal.   Neurological: She is alert.  Skin: Skin is warm.  Psychiatric: She has a normal mood and affect.    ED Course  Procedures (including critical care time)  COORDINATION OF CARE:  10:59PM - imaging results reviewed and shows subtle irregularity of the articular surface of the lateral femoral condyle. She will be referred to an orthopedic specialist. Crutches, knee immobilizer, and naprosyn will be ordered for [employer]. Naprosyn will be prescribed. She is advised to apply ice and use OTC ibuprofen at home. She is ready for d/c.   Labs Reviewed - No data to display Dg Knee Complete 4 Views Left  10/15/2012   *RADIOLOGY REPORT*  Clinical Data: History of fall complaining of knee injury.  LEFT KNEE - COMPLETE 4+ VIEW  Comparison: No priors.  Findings: Four views of the left knee demonstrate no definite acute displaced fracture, subluxation or dislocation.  However, there is a very subtle mild irregularity of the articular surface of the lateral femoral condyle.  IMPRESSION: 1. Subtle irregularity of the articular surface of the lateral femoral condyle.  This could be indicative of an osteochondral injury.  Clinical correlation is recommended, with consideration for follow-up non emergent MRI of the knee for further evaluation if there is concern for potential osteochondral injury.   Original Report Authenticated By: Trudie Reed, M.D.     1. Knee injury, left, initial encounter    Patient seen and examined. Work-up initiated. Medications ordered. X-ray results reviewed by myself and with patient.   Vital signs reviewed and are as follows: BP 111/66  Pulse 71  Temp(Src) 98 F (36.7 C)  Resp 18  Ht 5\' 5"  (1.651 m)  Wt 140 lb (63.504 kg)  BMI 23.3 kg/m2  SpO2 100%  LMP 10/08/2012   Patient was counseled on RICE protocol and told to rest injury, use ice for no longer than 15 minutes every hour, compress the area, and elevate above the level of their heart as much as possible  to reduce swelling.  Questions answered.  Patient verbalized understanding.     MDM  Patient with knee injury. X-rays are negative however there is concern for osteochondral injury, ligamentous injury. Knee immobilizer and crutches given as well as orthopedic followup.   I personally performed the services described in this documentation, which was scribed in my presence. The recorded information has been reviewed and is accurate.       Carla Crigler, PA-C 10/16/12 [phone]

## 2012-10-17 NOTE — ED Provider Notes (Signed)
Medical screening examination/treatment/procedure(s) were performed by non-physician practitioner and as supervising physician I was immediately available for consultation/collaboration.  Geralynn Capri R. Ellamarie Naeve, MD 10/17/12 2318 

## 2013-08-22 ENCOUNTER — Telehealth: Payer: Self-pay | Admitting: Gynecologic Oncology

## 2013-08-22 NOTE — Telephone Encounter (Signed)
Office Visit:  GYN ONCOLOGY  Chief Complaint: Ovarian LMP  Assessment/Plan:  [name] is a 23 y.o. year old G53P2 with a serous low malignant potential tumor. The patient at this time thinks that she may wish to be pregnant one more time to have a daughter. The options presented to [name] were as follows.  1. CT scan of the abdomen and pelvis as a baseline study with ultrasounds every 6 months between days 3-10 of her cycle, and followup pelvic examinations every 6 months for the next 5 years.  2 Right salpingo-oophorectomy with omentectomy and followup with pelvic examinations every 6 months for the next 5 years  3. Bilateral salpingo-oophorectomy and omentectomy  Option 1 was recommended to the patient and this is what she feels at this time she will pursue. The patient is aware that our recommendation is for bilateral salpingo-oophorectomy when she has completed her childbearing. She is aware that there is the possibility of recurrent disease. If recurrent disease is identified it is generally surgically treated. She also understands that there is a low risk of progression to malignancy and this as reported in the literature among the women who have had a minimum an oophorectomy is less than 5%.  At her visit with Dr.Bovard 02/11/2012 I have strongly recommended that she discuss oral contraceptive pills as opposed to Depo Provera.  I have asked her to followup in 3 months for rediscussion regarding the options presented.  F/U with Dr. [name] to discuss OCP on 02/11/2012.      HPI: 75 G2P2 patient underwent C/S 01/01/2012 for HELLP. Right ovarian cystectomy performed and was notable for serous LMP.  No family history of gyn malignancy.  Interval History: Patient denies abdominal bloating changes in bowel or rectal habits cough shortness of breath. Review of the operative note is notable for the fact that no peritoneal lesions were appreciated. The patient is not sure  whether or not she wishes to have more children   Social Hx:  History    Social History   .  Marital Status:  Single     Spouse Name:  N/A     Number of Children:  N/A   .  Years of Education:  N/A    Occupational History   .  Not on file.    Social History Main Topics   .  Smoking status:  Never Smoker   .  Smokeless tobacco:  Not on file   .  Alcohol Use:  No   .  Drug Use:  No   .  Sexually Active:  Yes    Other Topics  Concern   .  Not on file    Social History Narrative   .  No narrative on file   Past Surgical Hx:  Past Surgical History   Procedure  Date   .  No past surgeries    .  Cesarean section  01/01/2012     Procedure: CESAREAN SECTION; Surgeon: Thornell Sartorius, MD; Location: [facility]; Service: Gynecology; Laterality: N/A; Primary cesarean section of baby boy at 3   Past Medical Hx:  Past Medical History   Diagnosis  Date   .  Pregnancy induced hypertension    .  Anxiety    .  Neuromuscular disorder      complaints of leg pain since 6th grade   .  HELLP syndrome in third trimester  12/31/2011   .  S/P emergency cesarean section  01/01/2012  Past Gynecological History: No LMP recorded.    Family Hx: No family history on file.    Review of Systems:  Constitutional  Feels well, Breast feeding every 2 hours. Feels tired  Cardiovascular  No chest pain, shortness of breath, or edema  Pulmonary  No cough or wheeze.  Gastro Intestinal  No nausea, vomitting, or diarrhoea. No bright red blood per rectum, no abdominal pain, change in bowel movement, or constipation.  Genito Urinary  Reports vaginal discharge  Musculo Skeletal  Leg cramps with evening stretches.  Neurologic  No headache  Psychology  Reports depression that she attributes, anxiety, insomnia.   Vitals:   Physical Exam:  WD in NAD  Neck  Supple NROM, without any enlargements.  Lymph Node Survey  No cervical supraclavicular or inguinal adenopathy  Cardiovascular  Pulse normal  rate, regularity and rhythm. S1 and S2 normal.  Lungs  Clear to auscultation bilaterally. Good air movement.  Skin  Acne of the face  Psychiatry  Alert and oriented to person, place, and time  Abdomen  Normoactive bowel sounds, abdomen soft, non-tender Pfannenstiel incision intact without evidence of hernia, cellulitis or discharge.  Back  No CVA tenderness  Genito Urinary  Vulva/vagina: Normal external female genitalia. No lesions. No discharge or bleeding.  Bladder/urethra: No lesions or masses  Vagina: Well estrogenized normal rugae no vaginal discharge or bleeding present Cervix: Approximately 3 cm Normal appearing, no lesions.  Uterus: Mobile, no parametrial involvement or nodularity.  Adnexa: No palpable masses.  Rectal  Deferred  Extremities  No bilateral cyanosis, clubbing or edema.

## 2013-08-23 ENCOUNTER — Ambulatory Visit: Payer: Medicaid Other | Attending: Gynecologic Oncology | Admitting: Gynecologic Oncology

## 2013-08-23 DIAGNOSIS — Z3490 Encounter for supervision of normal pregnancy, unspecified, unspecified trimester: Secondary | ICD-10-CM

## 2013-08-23 DIAGNOSIS — Z331 Pregnant state, incidental: Secondary | ICD-10-CM | POA: Insufficient documentation

## 2013-08-23 DIAGNOSIS — C569 Malignant neoplasm of unspecified ovary: Secondary | ICD-10-CM | POA: Insufficient documentation

## 2013-08-23 NOTE — Patient Instructions (Signed)
Follow-up with UTZ 09/06/2013  Follow-up with Gyn Onc 09/25/2013  Congratulations

## 2013-08-23 NOTE — Progress Notes (Signed)
Office Visit:  GYN ONCOLOGY  Chief Complaint: Ovarian LMP IUP 7 6/7  Assessment/Plan:  [name] is a 22 y.o. 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  Patient was lost to follow-up.  Now presents with IUP 76/7.  There is a planned obstetric UTZ 09/06/2013.  Ms Carla [name] was advised to F/U after the UTZ.  SHe reports that she is satisfied with her parity.  If the ovary appears abnormal on UTZ will discuss interval RSO in the second trimester.  Otherwise would recommend RSO if C/S is required for delivery.  If the patient consents to sterilization would recommend bilateral salpingectomy and right oophorectomy.      HPI: 9  G3P2 patient underwent C/S 01/01/2012 for HELLP. Right ovarian cystectomy performed and was notable for serous LMP.  No family history of gyn malignancy.   Patient was lost to follow-up.  Presents today without complaints.  IUP 7 6/7 EDC 04/06/2014.  Reports that she is satisfied with her parity.   Social Hx: Engaged.  Children doing well.  Satisfied with parity. Engaged, wedding delayed until after delivery  Past Surgical Hx:  Past Surgical History   Procedure  Date   .  No past surgeries    .  Cesarean section  01/01/2012     Procedure: CESAREAN SECTION; Surgeon: Thornell Sartorius, MD; Location: [facility]; Service: Gynecology; Laterality: N/A; Primary cesarean section of baby boy at 28   Past Medical Hx:  Past Medical History  Diagnosis Date  . Pregnancy induced hypertension   . Anxiety   . Neuromuscular disorder     complaints of leg pain since 6th grade  . HELLP syndrome in third trimester 12/31/2011  . S/P emergency cesarean section 01/01/2012    Past Gynecological History: G3P2  IUP 76/7 [facility] 04/06/2014   Family Hx: No family history on file.    Review of Systems:  Constitutional  Feels well,   Cardiovascular  No chest pain, shortness of  breath, or edema  Pulmonary  No cough or wheeze.  Gastro Intestinal  No nausea, vomitting, or diarrhoea. No bright red blood per rectum, no abdominal pain, no abdominal bloating Genito Urinary  No bleeding or  vaginal discharge  Neurologic  No headache  Psychology  Feels well   Physical Exam:  WD in NAD  Neck  Supple NROM, without any enlargements.  Lymph Node Survey  No cervical supraclavicular or inguinal adenopathy  Cardiovascular  Pulse normal rate, regularity and rhythm.  Lungs  Clear to auscultation bilaterally. Good air movement.  Abdomen  Normoactive bowel sounds, abdomen soft, non-tender Pfannenstiel incision intact without evidence of hernia, cellulitis or discharge.  Back  No CVA tenderness  Genito Urinary  Vulva/vagina: Normal external female genitalia. No lesions. No discharge or bleeding.  Vagina: Well estrogenized normal rugae no vaginal discharge or bleeding present  Cervix: Approximately 4 cm Uterus: Mobile, no parametrial involvement or nodularity.  Adnexa: No palpable masses.  Rectal  Deferred  Extremities  No bilateral cyanosis, clubbing or edema.

## 2013-09-06 LAB — OB RESULTS CONSOLE ABO/RH: RH TYPE: POSITIVE

## 2013-09-06 LAB — OB RESULTS CONSOLE HEPATITIS B SURFACE ANTIGEN: Hepatitis B Surface Ag: NEGATIVE

## 2013-09-06 LAB — OB RESULTS CONSOLE ANTIBODY SCREEN: ANTIBODY SCREEN: NEGATIVE

## 2013-09-06 LAB — OB RESULTS CONSOLE RPR: RPR: NONREACTIVE

## 2013-09-06 LAB — OB RESULTS CONSOLE GC/CHLAMYDIA
CHLAMYDIA, DNA PROBE: NEGATIVE
Gonorrhea: NEGATIVE

## 2013-09-06 LAB — OB RESULTS CONSOLE HIV ANTIBODY (ROUTINE TESTING): HIV: NONREACTIVE

## 2013-09-06 LAB — OB RESULTS CONSOLE RUBELLA ANTIBODY, IGM: Rubella: NON-IMMUNE/NOT IMMUNE

## 2013-09-17 ENCOUNTER — Telehealth: Payer: Self-pay | Admitting: *Deleted

## 2013-09-17 NOTE — Telephone Encounter (Signed)
Records received

## 2013-09-17 NOTE — Telephone Encounter (Signed)
Called pt regarding [place], spoke to her her mom as pt not available who advised she had an [place] at [facility] recently, they found a new cyst on her ovary. Requested pt's mother remind pt of her upcoming appt on June 25 with MD, to call for any concerns. Pt's mom confirme. [facility] requested copy of last office visit and [place], left message with Ebony Hail, triage Nurse for Dr. Ulanda Edison and Dr. [name].

## 2013-10-24 ENCOUNTER — Telehealth: Payer: Self-pay | Admitting: Gynecologic Oncology

## 2013-10-24 NOTE — Telephone Encounter (Signed)
Office Visit:  GYN ONCOLOGY  Chief Complaint: Ovarian LMP IUP XXX  Assessment/Plan:  [name] is a 23 y.o. 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  Patient was lost to follow-up.  Now presents with IUP 76/7.  There is a planned obstetric UTZ 09/06/2013.  Ms Carla [name] was advised to F/U after the UTZ.  She reports that she is satisfied with her parity.  If the ovary appears abnormal on UTZ will discuss interval RSO in the second trimester.  Otherwise would recommend RSO if C/S is required for delivery.  If the patient consents to sterilization would recommend bilateral salpingectomy and right oophorectomy.      HPI:22 y.o.  G3P2 patient underwent C/S 01/01/2012 for HELLP. Right ovarian cystectomy performed and was notable for serous LMP.  No family history of gyn malignancy.   Patient was lost to follow-up.  Presents today without complaints.  IUP 7 6/7 [facility] 04/06/2014.  Reports that she is satisfied with her parity.   Social Hx: Engaged.  Children doing well.  Satisfied with parity. Engaged, wedding delayed until after delivery  Past Surgical Hx:  Past Surgical History   Procedure  Date   .  No past surgeries    .  Cesarean section  01/01/2012     Procedure: CESAREAN SECTION; Surgeon: Thornell Sartorius, MD; Location: [place] [facility]; Service: Gynecology; Laterality: N/A; Primary cesarean section of baby boy at 60   Past Medical Hx:  Past Medical History  Diagnosis Date  . Pregnancy induced hypertension   . Anxiety   . Neuromuscular disorder     complaints of leg pain since 6th grade  . HELLP syndrome in third trimester 12/31/2011  . S/P emergency cesarean section 01/01/2012    Past Gynecological History: G3P2  IUP 76/7 [facility] 04/06/2014   Family Hx: No family history on file.    Review of Systems:  Constitutional  Feels well,   Cardiovascular  No chest pain, shortness of  breath, or edema  Pulmonary  No cough or wheeze.  Gastro Intestinal  No nausea, vomitting, or diarrhoea. No bright red blood per rectum, no abdominal pain, no abdominal bloating Genito Urinary  No bleeding or  vaginal discharge  Neurologic  No headache  Psychology  Feels well   Physical Exam:  WD in NAD  Neck  Supple NROM, without any enlargements.  Lymph Node Survey  No cervical supraclavicular or inguinal adenopathy  Cardiovascular  Pulse normal rate, regularity and rhythm.  Lungs  Clear to auscultation bilaterally. Good air movement.  Abdomen  Normoactive bowel sounds, abdomen soft, non-tender Pfannenstiel incision intact without evidence of hernia, cellulitis or discharge.  Back  No CVA tenderness  Genito Urinary  Vulva/vagina: Normal external female genitalia. No lesions. No discharge or bleeding.  Vagina: Well estrogenized normal rugae no vaginal discharge or bleeding present  Cervix: Approximately 4 cm Uterus: Mobile, no parametrial involvement or nodularity.  Adnexa: No palpable masses.  Rectal  Deferred  Extremities  No bilateral cyanosis, clubbing or edema.

## 2013-10-25 ENCOUNTER — Ambulatory Visit: Payer: Self-pay | Attending: Gynecologic Oncology | Admitting: Gynecologic Oncology

## 2013-10-25 ENCOUNTER — Telehealth: Payer: Self-pay | Admitting: *Deleted

## 2013-10-25 NOTE — Telephone Encounter (Signed)
Pt was scheduled for a 3:45 appointment with Dr. Skeet Latch today(10/25/2013). Pt stopped by the office At 4:30pm. Pt stated that she was sorry she missed her appointment but was held up at another doctor's appointment with her son.  Pt new appointment is July 23,2015 with Dr. Skeet Latch.

## 2013-11-07 LAB — US OB DETAIL ADDL GEST + 14 WK

## 2013-11-22 ENCOUNTER — Ambulatory Visit: Payer: Medicaid Other | Attending: Gynecologic Oncology | Admitting: Gynecologic Oncology

## 2013-11-22 ENCOUNTER — Encounter: Payer: Self-pay | Admitting: Gynecologic Oncology

## 2013-11-22 VITALS — BP 123/69 | HR 104 | Temp 98.4°F | Resp 18 | Ht 65.0 in | Wt 178.3 lb

## 2013-11-22 DIAGNOSIS — O9989 Other specified diseases and conditions complicating pregnancy, childbirth and the puerperium: Principal | ICD-10-CM

## 2013-11-22 DIAGNOSIS — C569 Malignant neoplasm of unspecified ovary: Secondary | ICD-10-CM

## 2013-11-22 DIAGNOSIS — O99891 Other specified diseases and conditions complicating pregnancy: Secondary | ICD-10-CM | POA: Insufficient documentation

## 2013-11-22 DIAGNOSIS — D4959 Neoplasm of unspecified behavior of other genitourinary organ: Secondary | ICD-10-CM | POA: Insufficient documentation

## 2013-11-22 NOTE — Patient Instructions (Signed)
Follow-up after delivery.  Agree with plan for RSO at the time of repeat c/s    Thank you very much [name] for allowing me to provide care for you today.  I appreciate your confidence in choosing our Gynecologic Oncology team.  If you have any questions about your visit today please call our office and we will get back to you as soon as possible.  Please consider using the website Medlineplus.gov as an Geneticist, molecular.   Francetta Found. Nini Cavan MD., PhD Gynecologic Oncology

## 2013-11-22 NOTE — Progress Notes (Signed)
Office Visit:  GYN ONCOLOGY  Chief Complaint: Ovarian LMP  Assessment/Plan:  [name] is a 23 y.o. 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  Patient was lost to follow-up.  Now presents with IUP and [facility] 04/06/2014 Patient does not desire surgical evaluation while pregnant.  Obstetric UTZ 11/07/2013 did not comment on the characteristics of the right adnexa.  Rec RSO at time of repeat C/S.     HPI:22 y.o.  G3P2 patient underwent C/S 01/01/2012 for HELLP. Right ovarian cystectomy performed and was notable for serous LMP.  No family history of gyn malignancy.  UTZ without comment on right adnexa. Plan for repeat C/S.  [name] does not desire any surgical intervention until delivery.   Social Hx: Engaged.  Children doing well.  Satisfied with parity. Engaged, wedding delayed until after delivery  Past Surgical Hx:  Past Surgical History   Procedure  Date   .  No past surgeries    .  Cesarean section  01/01/2012     Procedure: CESAREAN SECTION; Surgeon: Thornell Sartorius, MD; Location: [place] [facility]; Service: Gynecology; Laterality: N/A; Primary cesarean section of baby boy at 18   Past Medical Hx:  Past Medical History  Diagnosis Date  . Pregnancy induced hypertension   . Anxiety   . Neuromuscular disorder     complaints of leg pain since 6th grade  . HELLP syndrome in third trimester 12/31/2011  . S/P emergency cesarean section 01/01/2012    Past Gynecological History: G3P2   [facility] 04/06/2014   Family Hx: No family history on file.    Review of Systems:  Constitutional  Feels well,   Cardiovascular  No chest pain, shortness of breath, or edema  Pulmonary  No cough or wheeze.  Gastro Intestinal  No nausea, vomitting, or diarrhoea. No bright red blood per rectum, no abdominal pain, no abdominal bloating Genito Urinary  No bleeding or  vaginal discharge   Neurologic  No headache, no lower extremity edema Psychology  Feels well   Physical Exam:  BP 123/69  Pulse 104  Temp(Src) 98.4 F (36.9 C) (Oral)  Resp 18  Ht 5\' 5"  (1.651 m)  Wt 178 lb 4.8 oz (80.876 kg)  BMI 29.67 kg/m2  WD in NAD  Neck  Supple NROM, without any enlargements.  Lymph Node Survey  No cervical supraclavicular or inguinal adenopathy  Cardiovascular  Pulse normal rate, regularity and rhythm.  Lungs  Clear to auscultation bilaterally.Abdomen  Normoactive bowel sounds, abdomen soft, non-tender Pfannenstiel incision intact.  Gravid uterus.  No abdominal tenderness. Back  No CVA tenderness  Extremities  No bilateral cyanosis, clubbing or edema.

## 2014-01-17 ENCOUNTER — Encounter: Payer: Self-pay | Admitting: Neurology

## 2014-01-17 DIAGNOSIS — O26899 Other specified pregnancy related conditions, unspecified trimester: Secondary | ICD-10-CM | POA: Insufficient documentation

## 2014-01-17 DIAGNOSIS — R51 Headache: Secondary | ICD-10-CM

## 2014-01-18 ENCOUNTER — Ambulatory Visit: Payer: Medicaid Other | Admitting: Neurology

## 2014-01-25 ENCOUNTER — Ambulatory Visit (INDEPENDENT_AMBULATORY_CARE_PROVIDER_SITE_OTHER): Payer: Medicaid Other | Admitting: Neurology

## 2014-01-25 ENCOUNTER — Encounter (INDEPENDENT_AMBULATORY_CARE_PROVIDER_SITE_OTHER): Payer: Self-pay

## 2014-01-25 ENCOUNTER — Encounter: Payer: Self-pay | Admitting: Neurology

## 2014-01-25 VITALS — BP 111/72 | HR 92 | Ht 65.0 in | Wt 189.0 lb

## 2014-01-25 DIAGNOSIS — G43909 Migraine, unspecified, not intractable, without status migrainosus: Secondary | ICD-10-CM

## 2014-01-25 MED ORDER — MAGNESIUM OXIDE 400 MG PO TABS: 400.0000 mg | ORAL_TABLET | Freq: Two times a day (BID) | ORAL | Status: DC

## 2014-01-25 MED ORDER — BUTALBITAL-APAP-CAFFEINE 50-325-40 MG PO TABS: 1.0000 | ORAL_TABLET | Freq: Two times a day (BID) | ORAL | Status: DC | PRN

## 2014-01-25 MED ORDER — RIBOFLAVIN 100 MG PO CAPS: 100.0000 mg | ORAL_CAPSULE | Freq: Two times a day (BID) | ORAL | Status: DC

## 2014-01-25 NOTE — Progress Notes (Signed)
PATIENT: Carla [name] DOB: [birth year]  HISTORICAL  Carla [name] is a 23 years old African American female, currently [redacted] weeks pregnant, she is referred by her primary care Dr. Eddie Dibbles for evaluation of chronic headaches  She had a history of migraine for many years, getting worse over the past 3 years, especially during her most recent pregnancy, over the years, she has gradually developed the habit of taking Tylenol daily because daily recurrent headaches, next  Over the past one year, every day, she would develop this lateralized throbbing headaches, with associated light noise sensitivity, nauseous, lasting for couple hours, she will take one to 2 tablets of Tylenol, 3-4 hours later, she would develop similar headaches again, require another dose of Tylenol  Trigger for her migraines are bright light, heat, exertion, stress, sleep deprivation,  She has never tried preventive medications in the past, she denied visual change, no lateralized motor or sensory deficit.  REVIEW OF SYSTEMS: Full 14 system review of systems performed and notable only for frequent headaches  ALLERGIES: Allergies  Allergen Reactions  . Latex     HOME MEDICATIONS: Current Outpatient Prescriptions on File Prior to Visit  Medication Sig Dispense Refill  . acetaminophen (TYLENOL) 500 MG tablet Take 500 mg by mouth every 6 (six) hours as needed. pain      . Prenatal Vit-Fe Fumarate-FA (PRENATAL MULTIVITAMIN) TABS Take 1 tablet by mouth daily.  30 tablet  1  . labetalol (NORMODYNE) 200 MG tablet Take 1 tablet (200 mg total) by mouth 2 (two) times daily.  60 tablet  1     PAST MEDICAL HISTORY: Past Medical History  Diagnosis Date  . Pregnancy induced hypertension   . Anxiety   . Neuromuscular disorder     complaints of leg pain since 6th grade  . HELLP syndrome in third trimester 12/31/2011  . S/P emergency cesarean section 01/01/2012  . Pregnancy headache     PAST SURGICAL HISTORY: Past  Surgical History  Procedure Laterality Date  . No past surgeries    . Cesarean section  01/01/2012    Procedure: CESAREAN SECTION;  Surgeon: Thornell Sartorius, MD;  Location: [place] [facility];  Service: Gynecology;  Laterality: N/A;  Primary cesarean section of baby  boy at 60  . Ovarian cystectomy      FAMILY HISTORY: Family History  Problem Relation Age of Onset  . ADD / ADHD Brother   . ADD / ADHD Mother   . Diabetes Maternal Aunt   . Diabetes Maternal Uncle   . High blood pressure    . High Cholesterol      SOCIAL HISTORY:  History   Social History  . Marital Status: Single    Spouse Name: N/A    Number of Children: 2  . 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 Smoking status: Never Smoker   . Smokeless tobacco: Never Used  . Alcohol Use: No  . Drug Use: No  . Sexual Activity: Yes   Other Topics Concern  . Not on file   Social History Narrative   Patient lives at home with her husband Erlene Quan). Patient is a homemaker.   Medical illustrator.   Right handed.   Caffeine 2-3 times a week.    PHYSICAL EXAM   Filed Vitals:   01/25/14 1301  BP: 111/72  Pulse: 92  Height: 5\' 5"  (1.651 m)  Weight: 189 lb (  85.73 kg)     Body mass index is 31.45 kg/(m^2).   Generalized: In no acute distress  Neck: Supple, no carotid bruits   Cardiac: Regular rate rhythm  Pulmonary: Clear to auscultation bilaterally  Musculoskeletal: No deformity  Neurological examination  Mentation: Alert oriented to time, place, history taking, and causual conversation  Cranial nerve II-XII: Pupils were equal round reactive to light. Extraocular movements were full.  Visual field were full on confrontational test. Bilateral fundi were sharp.  Facial sensation and strength were normal. Hearing was intact to finger rubbing bilaterally. Uvula tongue midline.  Head turning and shoulder shrug and were normal and symmetric.Tongue protrusion  into cheek strength was normal.  Motor: Normal tone, bulk and strength.  Sensory: Intact to fine touch, pinprick, preserved vibratory sensation, and proprioception at toes.  Coordination: Normal finger to nose, heel-to-shin bilaterally there was no truncal ataxia  Gait: Rising up from seated position without assistance, normal stance, without trunk ataxia, moderate stride, good arm swing, smooth turning, able to perform tiptoe, and heel walking without difficulty.   Romberg signs: Negative  Deep tendon reflexes: Brachioradialis 2/2, biceps 2/2, triceps 2/2, patellar 2/2, Achilles 2/2, plantar responses were flexor bilaterally.   DIAGNOSTIC DATA (LABS, IMAGING, TESTING) - I reviewed patient records, labs, notes, testing and imaging myself where available.  Lab Results  Component Value Date   WBC 12.8* 01/04/2012   HGB 11.6* 01/04/2012   HCT 34.2* 01/04/2012   MCV 87.7 01/04/2012   PLT 125* 01/04/2012      Component Value Date/Time   NA 136 01/04/2012 0620   K 4.6 01/04/2012 0620   CL 104 01/04/2012 0620   CO2 21 01/04/2012 0620   GLUCOSE 79 01/04/2012 0620   BUN 16.0 01/20/2012 1039   BUN 21 01/04/2012 0620   CREATININE 1.0 01/20/2012 1039   CREATININE 1.24* 01/04/2012 0620   CALCIUM 7.4* 01/04/2012 0620   PROT 4.7* 01/04/2012 0620   ALBUMIN 1.7* 01/04/2012 0620   AST 73* 01/04/2012 0620   ALT 72* 01/04/2012 0620   ALKPHOS 119* 01/04/2012 0620   BILITOT 0.2* 01/04/2012 0620   GFRNONAA 63* 01/04/2012 0620   GFRAA 72* 01/04/2012 0620    ASSESSMENT AND PLAN  Carla [name] is a 23 y.o. 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 Start preventive medications magnesium oxide 400 mg twice a day, riboflavin 100 mg twice a day 2. Fioricet as needed limited to 2-3 times each week 3. Gradually wean herself off daily Tylenol use, also limited to 2-3 times each week 4. Return to clinic in 1-2 months    Marcial Pacas, M.D. Ph.D.  [facility] [address], [place], [place] [address] [phone]

## 2014-03-04 ENCOUNTER — Encounter: Payer: Self-pay | Admitting: Neurology

## 2014-03-12 ENCOUNTER — Encounter (HOSPITAL_COMMUNITY): Payer: Self-pay | Admitting: *Deleted

## 2014-03-12 ENCOUNTER — Inpatient Hospital Stay (HOSPITAL_COMMUNITY): Payer: Medicaid Other

## 2014-03-12 ENCOUNTER — Inpatient Hospital Stay (HOSPITAL_COMMUNITY)
Admission: AD | Admit: 2014-03-12 | Discharge: 2014-03-12 | Disposition: A | Discharge status: Home / Self Care | Payer: Medicaid Other | Source: Ambulatory Visit | Attending: Obstetrics and Gynecology | Admitting: Obstetrics and Gynecology

## 2014-03-12 DIAGNOSIS — Z3A36 36 weeks gestation of pregnancy: Secondary | ICD-10-CM | POA: Insufficient documentation

## 2014-03-12 DIAGNOSIS — Z3493 Encounter for supervision of normal pregnancy, unspecified, third trimester: Secondary | ICD-10-CM

## 2014-03-12 DIAGNOSIS — O288 Other abnormal findings on antenatal screening of mother: Secondary | ICD-10-CM | POA: Insufficient documentation

## 2014-03-12 DIAGNOSIS — O4703 False labor before 37 completed weeks of gestation, third trimester: Secondary | ICD-10-CM

## 2014-03-12 DIAGNOSIS — O9989 Other specified diseases and conditions complicating pregnancy, childbirth and the puerperium: Secondary | ICD-10-CM | POA: Diagnosis present

## 2014-03-12 LAB — POCT FERN TEST: POCT Fern Test: NEGATIVE

## 2014-03-12 NOTE — MAU Provider Note (Signed)
  150's, good var.  Category 1 BPP for wandering baseline

## 2014-03-12 NOTE — MAU Note (Signed)
Pt stated she has felt some leaking earlier tonight that wet her panty liner.

## 2014-03-12 NOTE — Discharge Instructions (Signed)
Normal Labor and Delivery  Your caregiver must first be sure you are in labor. Signs of labor include:   You may pass what is called "the mucus plug" before labor begins. This is a small amount of blood stained mucus.  Regular uterine contractions.  The time between contractions get closer together.  The discomfort and pain gradually gets more intense.  Pains are mostly located in the back.  Pains get worse when walking.  The cervix (the opening of the uterus becomes thinner (begins to efface) and opens up (dilates).   Once you are in labor and admitted into the hospital or care center, your caregiver will do the following:   A complete physical examination.  Check your vital signs (blood pressure, pulse, temperature and the fetal heart rate).  Do a vaginal examination (using a sterile glove and lubricant) to determine:  The position (presentation) of the baby (head [vertex] or buttock first).  The level (station) of the baby's head in the birth canal.  The effacement and dilatation of the cervix.  An electronic monitor is usually placed on your abdomen. The monitor follows the length and intensity of the contractions, as well as the baby's heart rate.  your caregiver may insert an IV in your arm with a bottle of sugar water. This is done as a precaution so that medications can be given to you quickly during labor or delivery.   NORMAL LABOR AND DELIVERY IS DIVIDED UP INTO 3 STAGES:  First Stage This is when regular contractions begin and the cervix begins to efface and dilate. This stage can last from 3 to 15 hours. The end of the first stage is when the cervix is 100% effaced and 10 centimeters dilated. Pain medications may be given by   Injection (morphine, demerol, etc.)  Regional anesthesia (spinal, caudal or epidural, anesthetics given in different locations of the spine). Paracervical pain medication may be given, which is an injection of and anesthetic on each  side of the cervix. A pregnant woman may request to have "Natural Childbirth" which is not to have any medications or anesthesia during her labor and delivery.  Second Stage This is when the baby comes down through the birth canal (vagina) and is born. This can take 1 to 4 hours. As the baby's head comes down through the birth canal, you may feel like you are going to have a bowel movement. You will get the urge to bear down and push until the baby is delivered. As the baby's head is being delivered, the caregiver will decide if an episiotomy (a cut in the perineum and vagina area) is needed to prevent tearing of the tissue in this area. The episiotomy is sewn up after the delivery of the baby and placenta. Sometimes a mask with nitrous oxide is given for the mother to breath during the delivery of the baby to help if there is too much pain. The end of Stage 2 is when the baby is fully delivered. Then when the umbilical cord stops pulsating it is clamped and cut.  Third Stage The third stage begins after the baby is completely delivered and ends after the placenta (afterbirth) is delivered. This usually takes 5 to 30 minutes. After the placenta is delivered, a medication is given either by intravenous or injection to help contract the uterus and prevent bleeding. The third stage is not painful and pain medication is usually not necessary. If there was a tear, it is repaired at this time.  After the delivery, the mother is watched and monitored closely for 1 to 2 hours to make sure there is no postpartum bleeding (hemorrhage). If there is a lot of bleeding, medication is given to contract the uterus and stop the bleeding.   Document Released: 01/27/2008 Document Revised: 07/12/2011 Document Reviewed: 01/27/2008 [facility] Patient Information 2013 [place].

## 2014-03-12 NOTE — MAU Provider Note (Signed)
History     CSN: [id number]  Arrival date and time: 03/12/14 1904   None     Chief Complaint  Patient presents with  . Rupture of Membranes   HPI  Patient is 23 y.o. [id number] [redacted]w[redacted]d here with complaints of LOF at 1815.  +FM, denies VB, contractions     Past Medical History  Diagnosis Date  . Pregnancy induced hypertension   . Anxiety   . Neuromuscular disorder     complaints of leg pain since 6th grade  . HELLP syndrome in third trimester 12/31/2011  . S/P emergency cesarean section 01/01/2012  . Pregnancy headache     Past Surgical History  Procedure Laterality Date  . No past surgeries    . Cesarean section  01/01/2012    Procedure: CESAREAN SECTION;  Surgeon: Thornell Sartorius, MD;  Location: [place] [facility];  Service: Gynecology;  Laterality: N/A;  Primary cesarean section of baby  boy at 58  . Ovarian cystectomy      Family History  Problem Relation Age of Onset  . ADD / ADHD Brother   . ADD / ADHD Mother   . Diabetes Maternal Aunt   . Diabetes Maternal Uncle   . High blood pressure    . High Cholesterol      History  Substance Use Topics  . Smoking status: Never Smoker   . Smokeless tobacco: Never Used  . Alcohol Use: No    Allergies:  Allergies  Allergen Reactions  . Latex     Prescriptions prior to admission  Medication Sig Dispense Refill Last Dose  . acetaminophen (TYLENOL) 500 MG tablet Take 500 mg by mouth every 6 (six) hours as needed. pain   Taking  . butalbital-acetaminophen-caffeine (FIORICET, ESGIC) 50-325-40 MG per tablet Take 1 tablet by mouth every 12 (twelve) hours as needed for headache. 15 tablet 5   . magnesium oxide (MAG-OX) 400 MG tablet Take 1 tablet (400 mg total) by mouth 2 (two) times daily. 60 tablet 11   . Prenatal Vit-Fe Fumarate-FA (PRENATAL MULTIVITAMIN) TABS Take 1 tablet by mouth daily. 30 tablet 1 Taking  . Riboflavin 100 MG CAPS Take 1 capsule (100 mg total) by mouth 2 (two) times daily. 60 capsule 11     Review of  Systems  Constitutional: Negative for fever and chills.  Respiratory: Negative for cough and shortness of breath.   Cardiovascular: Negative for chest pain and leg swelling.  Gastrointestinal: Negative for heartburn, nausea, vomiting and diarrhea.  Genitourinary: Negative for dysuria and urgency.  Neurological:       No headache   Physical Exam   Blood pressure 111/69, pulse 92, temperature 98.5 F (36.9 C), resp. rate 18, height 5\' 6"  (1.676 m).  Physical Exam  Constitutional: She is oriented to person, place, and time. She appears well-developed and well-nourished.  HENT:  Head: Normocephalic and atraumatic.  Eyes: Conjunctivae and EOM are normal.  Neck: Normal range of motion.  Cardiovascular: Normal rate, regular rhythm and normal heart sounds.   Respiratory: Effort normal. No respiratory distress.  GI: Soft. Bowel sounds are normal. She exhibits no distension. There is no tenderness.  Genitourinary:  Negative pooling, negative valsalva, neg fern  Musculoskeletal: Normal range of motion. She exhibits no edema.  Neurological: She is alert and oriented to person, place, and time.  Skin: Skin is warm and dry. No erythema.    MAU Course  Procedures  MDM NST reactive  Assessment and Plan  Labor precautions, no signs of rupture  of membranes at this time  Carla [name] ROCIO 03/12/2014, 7:43 PM

## 2014-03-29 ENCOUNTER — Encounter (HOSPITAL_COMMUNITY): Payer: Self-pay

## 2014-03-29 ENCOUNTER — Encounter (HOSPITAL_COMMUNITY)
Admission: RE | Admit: 2014-03-29 | Discharge: 2014-03-29 | Disposition: A | Discharge status: Home / Self Care | Payer: Medicaid Other | Source: Ambulatory Visit | Attending: Obstetrics and Gynecology | Admitting: Obstetrics and Gynecology

## 2014-03-29 LAB — CBC
HCT: 34.3 % — ABNORMAL LOW (ref 36.0–46.0)
HEMOGLOBIN: 11.2 g/dL — AB (ref 12.0–15.0)
MCH: 27.2 pg (ref 26.0–34.0)
MCHC: 32.7 g/dL (ref 30.0–36.0)
MCV: 83.3 fL (ref 78.0–100.0)
Platelets: 229 10*3/uL (ref 150–400)
RBC: 4.12 MIL/uL (ref 3.87–5.11)
RDW: 14.1 % (ref 11.5–15.5)
WBC: 6.6 10*3/uL (ref 4.0–10.5)

## 2014-03-29 LAB — TYPE AND SCREEN
ABO/RH(D): B POS
ANTIBODY SCREEN: NEGATIVE

## 2014-03-29 LAB — RPR

## 2014-03-29 NOTE — Patient Instructions (Signed)
Your procedure is scheduled on:04/01/14  Enter through the Main Entrance at :34 am Pick up desk phone and dial [phone] and inform us of your arrival.  Please call [phone] if you have any problems the morning of surgery.  Remember: Do not eat food or drink liquids, including water, after midnight:Sunday Clear liquids are ok until:7am Monday    You may brush your teeth the morning of surgery.  DO NOT wear jewelry, eye make-up, lipstick,body lotion, or dark fingernail polish.  (Polished toes are ok) You may wear deodorant.  If you are to be admitted after surgery, leave suitcase in car until your room has been assigned. Patients discharged on the day of surgery will not be allowed to drive home. Wear loose fitting, comfortable clothes for your ride home.
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
## 2014-04-01 ENCOUNTER — Inpatient Hospital Stay (HOSPITAL_COMMUNITY)
Admission: RE | Admit: 2014-04-01 | Discharge: 2014-04-04 | DRG: 766 | Disposition: A | Discharge status: Home / Self Care | Payer: Medicaid Other | Source: Ambulatory Visit | Attending: Obstetrics and Gynecology | Admitting: Obstetrics and Gynecology

## 2014-04-01 ENCOUNTER — Encounter (HOSPITAL_COMMUNITY): Payer: Self-pay | Admitting: Certified Registered"

## 2014-04-01 ENCOUNTER — Inpatient Hospital Stay (HOSPITAL_COMMUNITY): Payer: Medicaid Other | Admitting: Certified Registered"

## 2014-04-01 ENCOUNTER — Encounter (HOSPITAL_COMMUNITY)
Admission: RE | Disposition: A | Discharge status: Home / Self Care | Payer: Self-pay | Source: Ambulatory Visit | Attending: Obstetrics and Gynecology

## 2014-04-01 DIAGNOSIS — O34219 Maternal care for unspecified type scar from previous cesarean delivery: Secondary | ICD-10-CM

## 2014-04-01 DIAGNOSIS — Z23 Encounter for immunization: Secondary | ICD-10-CM

## 2014-04-01 DIAGNOSIS — D3910 Neoplasm of uncertain behavior of unspecified ovary: Secondary | ICD-10-CM

## 2014-04-01 DIAGNOSIS — O3480 Maternal care for other abnormalities of pelvic organs, unspecified trimester: Secondary | ICD-10-CM | POA: Diagnosis present

## 2014-04-01 DIAGNOSIS — O3421 Maternal care for scar from previous cesarean delivery: Secondary | ICD-10-CM | POA: Diagnosis present

## 2014-04-01 DIAGNOSIS — D270 Benign neoplasm of right ovary: Secondary | ICD-10-CM | POA: Diagnosis present

## 2014-04-01 DIAGNOSIS — Z3483 Encounter for supervision of other normal pregnancy, third trimester: Secondary | ICD-10-CM | POA: Diagnosis present

## 2014-04-01 DIAGNOSIS — Z833 Family history of diabetes mellitus: Secondary | ICD-10-CM | POA: Diagnosis not present

## 2014-04-01 DIAGNOSIS — Z3A39 39 weeks gestation of pregnancy: Secondary | ICD-10-CM | POA: Diagnosis present

## 2014-04-01 DIAGNOSIS — O139 Gestational [pregnancy-induced] hypertension without significant proteinuria, unspecified trimester: Principal | ICD-10-CM | POA: Diagnosis present

## 2014-04-01 DIAGNOSIS — C561 Malignant neoplasm of right ovary: Secondary | ICD-10-CM

## 2014-04-01 DIAGNOSIS — O1420 HELLP syndrome (HELLP), unspecified trimester: Secondary | ICD-10-CM | POA: Diagnosis present

## 2014-04-01 DIAGNOSIS — Z98891 History of uterine scar from previous surgery: Secondary | ICD-10-CM

## 2014-04-01 HISTORY — DX: Neoplasm of uncertain behavior of unspecified ovary: D39.10

## 2014-04-01 HISTORY — DX: History of uterine scar from previous surgery: Z98.891

## 2014-04-01 SURGERY — Surgical Case
Anesthesia: Spinal | Site: Abdomen | Laterality: Right

## 2014-04-01 MED ORDER — MEASLES, MUMPS & RUBELLA VAC ~~LOC~~ INJ
0.5000 mL | INJECTION | Freq: Once | SUBCUTANEOUS | Status: AC
  Administered 2014-04-03: 0.5 mL via SUBCUTANEOUS
  Filled 2014-04-01: qty 0.5

## 2014-04-01 MED ORDER — ONDANSETRON HCL 4 MG/2ML IJ SOLN
INTRAMUSCULAR | Status: DC | PRN
  Administered 2014-04-01: 4 mg via INTRAVENOUS

## 2014-04-01 MED ORDER — OXYCODONE-ACETAMINOPHEN 5-325 MG PO TABS
2.0000 | ORAL_TABLET | ORAL | Status: DC | PRN
  Administered 2014-04-02: 1 via ORAL
  Administered 2014-04-03: 2 via ORAL
  Filled 2014-04-01: qty 2

## 2014-04-01 MED ORDER — DIBUCAINE 1 % RE OINT: 1.0000 "application " | TOPICAL_OINTMENT | RECTAL | Status: DC | PRN

## 2014-04-01 MED ORDER — LACTATED RINGERS IV SOLN
Freq: Once | INTRAVENOUS | Status: AC
  Administered 2014-04-01: 10:00:00 via INTRAVENOUS

## 2014-04-01 MED ORDER — OXYTOCIN 10 UNIT/ML IJ SOLN
INTRAMUSCULAR | Status: AC
  Filled 2014-04-01: qty 4

## 2014-04-01 MED ORDER — MORPHINE SULFATE (PF) 0.5 MG/ML IJ SOLN
INTRAMUSCULAR | Status: DC | PRN
  Administered 2014-04-01: .2 mg via EPIDURAL

## 2014-04-01 MED ORDER — SCOPOLAMINE 1 MG/3DAYS TD PT72
MEDICATED_PATCH | TRANSDERMAL | Status: AC
  Administered 2014-04-01: 1.5 mg via TRANSDERMAL
  Filled 2014-04-01: qty 1

## 2014-04-01 MED ORDER — ONDANSETRON HCL 4 MG/2ML IJ SOLN: 4.0000 mg | INTRAMUSCULAR | Status: DC | PRN

## 2014-04-01 MED ORDER — KETOROLAC TROMETHAMINE 30 MG/ML IJ SOLN
30.0000 mg | Freq: Once | INTRAMUSCULAR | Status: AC
  Administered 2014-04-01: 30 mg via INTRAMUSCULAR

## 2014-04-01 MED ORDER — OXYTOCIN 40 UNITS IN LACTATED RINGERS INFUSION - SIMPLE MED: 62.5000 mL/h | INTRAVENOUS | Status: AC

## 2014-04-01 MED ORDER — LACTATED RINGERS IV SOLN
INTRAVENOUS | Status: DC | PRN
  Administered 2014-04-01 (×2): via INTRAVENOUS

## 2014-04-01 MED ORDER — FENTANYL CITRATE 0.05 MG/ML IJ SOLN
INTRAMUSCULAR | Status: AC
  Filled 2014-04-01: qty 2

## 2014-04-01 MED ORDER — SIMETHICONE 80 MG PO CHEW: 80.0000 mg | CHEWABLE_TABLET | ORAL | Status: DC | PRN

## 2014-04-01 MED ORDER — PHENYLEPHRINE 8 MG IN D5W 100 ML (0.08MG/ML) PREMIX OPTIME
INJECTION | INTRAVENOUS | Status: AC
  Filled 2014-04-01: qty 100

## 2014-04-01 MED ORDER — DIPHENHYDRAMINE HCL 25 MG PO CAPS
25.0000 mg | ORAL_CAPSULE | Freq: Four times a day (QID) | ORAL | Status: DC | PRN
  Administered 2014-04-01 (×2): 25 mg via ORAL
  Filled 2014-04-01 (×3): qty 1

## 2014-04-01 MED ORDER — ZOLPIDEM TARTRATE 5 MG PO TABS: 5.0000 mg | ORAL_TABLET | Freq: Every evening | ORAL | Status: DC | PRN

## 2014-04-01 MED ORDER — CEFAZOLIN SODIUM-DEXTROSE 2-3 GM-% IV SOLR
2.0000 g | INTRAVENOUS | Status: AC
  Administered 2014-04-01: 2 g via INTRAVENOUS

## 2014-04-01 MED ORDER — PHENYLEPHRINE 8 MG IN D5W 100 ML (0.08MG/ML) PREMIX OPTIME
INJECTION | INTRAVENOUS | Status: DC | PRN
  Administered 2014-04-01: 60 ug/min via INTRAVENOUS

## 2014-04-01 MED ORDER — SCOPOLAMINE 1 MG/3DAYS TD PT72
1.0000 | MEDICATED_PATCH | Freq: Once | TRANSDERMAL | Status: DC
  Administered 2014-04-01: 1.5 mg via TRANSDERMAL

## 2014-04-01 MED ORDER — ONDANSETRON HCL 4 MG/2ML IJ SOLN
INTRAMUSCULAR | Status: AC
  Filled 2014-04-01: qty 2

## 2014-04-01 MED ORDER — PRENATAL MULTIVITAMIN CH
1.0000 | ORAL_TABLET | Freq: Every day | ORAL | Status: DC
  Administered 2014-04-02 – 2014-04-03 (×2): 1 via ORAL
  Filled 2014-04-01 (×2): qty 1

## 2014-04-01 MED ORDER — KETOROLAC TROMETHAMINE 30 MG/ML IJ SOLN
INTRAMUSCULAR | Status: AC
  Filled 2014-04-01: qty 1

## 2014-04-01 MED ORDER — BUPIVACAINE IN DEXTROSE 0.75-8.25 % IT SOLN
INTRATHECAL | Status: DC | PRN
  Administered 2014-04-01: 1.6 mL via INTRATHECAL

## 2014-04-01 MED ORDER — IBUPROFEN 800 MG PO TABS
800.0000 mg | ORAL_TABLET | Freq: Three times a day (TID) | ORAL | Status: DC
  Administered 2014-04-01 – 2014-04-04 (×8): 800 mg via ORAL
  Filled 2014-04-01 (×8): qty 1

## 2014-04-01 MED ORDER — OXYTOCIN 10 UNIT/ML IJ SOLN
40.0000 [IU] | INTRAVENOUS | Status: DC | PRN
  Administered 2014-04-01: 40 [IU] via INTRAVENOUS

## 2014-04-01 MED ORDER — HYDROMORPHONE HCL 1 MG/ML IJ SOLN: 0.2500 mg | INTRAMUSCULAR | Status: DC | PRN

## 2014-04-01 MED ORDER — LANOLIN HYDROUS EX OINT: 1.0000 "application " | TOPICAL_OINTMENT | CUTANEOUS | Status: DC | PRN

## 2014-04-01 MED ORDER — ONDANSETRON HCL 4 MG PO TABS: 4.0000 mg | ORAL_TABLET | ORAL | Status: DC | PRN

## 2014-04-01 MED ORDER — WITCH HAZEL-GLYCERIN EX PADS: 1.0000 "application " | MEDICATED_PAD | CUTANEOUS | Status: DC | PRN

## 2014-04-01 MED ORDER — SENNOSIDES-DOCUSATE SODIUM 8.6-50 MG PO TABS
2.0000 | ORAL_TABLET | ORAL | Status: DC
  Administered 2014-04-01 – 2014-04-04 (×3): 2 via ORAL
  Filled 2014-04-01 (×2): qty 2

## 2014-04-01 MED ORDER — LACTATED RINGERS IV SOLN
INTRAVENOUS | Status: DC
  Administered 2014-04-01: 23:00:00 via INTRAVENOUS

## 2014-04-01 MED ORDER — 0.9 % SODIUM CHLORIDE (POUR BTL) OPTIME
TOPICAL | Status: DC | PRN
  Administered 2014-04-01: 1000 mL

## 2014-04-01 MED ORDER — PHENYLEPHRINE HCL 10 MG/ML IJ SOLN
INTRAMUSCULAR | Status: DC | PRN
  Administered 2014-04-01 (×2): 80 ug via INTRAVENOUS
  Administered 2014-04-01: 40 ug via INTRAVENOUS

## 2014-04-01 MED ORDER — SIMETHICONE 80 MG PO CHEW
80.0000 mg | CHEWABLE_TABLET | Freq: Three times a day (TID) | ORAL | Status: DC
  Administered 2014-04-02 – 2014-04-04 (×6): 80 mg via ORAL
  Filled 2014-04-01 (×6): qty 1

## 2014-04-01 MED ORDER — OXYCODONE-ACETAMINOPHEN 5-325 MG PO TABS
1.0000 | ORAL_TABLET | ORAL | Status: DC | PRN
  Administered 2014-04-02 – 2014-04-03 (×4): 1 via ORAL
  Filled 2014-04-01 (×6): qty 1

## 2014-04-01 MED ORDER — MORPHINE SULFATE 0.5 MG/ML IJ SOLN
INTRAMUSCULAR | Status: AC
  Filled 2014-04-01: qty 10

## 2014-04-01 MED ORDER — FENTANYL CITRATE 0.05 MG/ML IJ SOLN
INTRAMUSCULAR | Status: DC | PRN
  Administered 2014-04-01: 25 ug via INTRAVENOUS

## 2014-04-01 MED ORDER — SIMETHICONE 80 MG PO CHEW
80.0000 mg | CHEWABLE_TABLET | ORAL | Status: DC
  Administered 2014-04-01 – 2014-04-04 (×3): 80 mg via ORAL
  Filled 2014-04-01 (×2): qty 1

## 2014-04-01 MED ORDER — LACTATED RINGERS IV SOLN
INTRAVENOUS | Status: DC
  Administered 2014-04-01: 11:00:00 via INTRAVENOUS

## 2014-04-01 MED ORDER — MENTHOL 3 MG MT LOZG: 1.0000 | LOZENGE | OROMUCOSAL | Status: DC | PRN

## 2014-04-01 MED ORDER — TETANUS-DIPHTH-ACELL PERTUSSIS 5-2.5-18.5 LF-MCG/0.5 IM SUSP: 0.5000 mL | Freq: Once | INTRAMUSCULAR | Status: DC

## 2014-04-01 MED ORDER — CEFAZOLIN SODIUM-DEXTROSE 2-3 GM-% IV SOLR
INTRAVENOUS | Status: AC
  Filled 2014-04-01: qty 50

## 2014-04-01 SURGICAL SUPPLY — 40 items
APL SKNCLS STERI-STRIP NONHPOA (GAUZE/BANDAGES/DRESSINGS) ×1
BENZOIN TINCTURE PRP APPL 2/3 (GAUZE/BANDAGES/DRESSINGS) ×2 IMPLANT
CLAMP CORD UMBIL (MISCELLANEOUS) IMPLANT
CLOSURE WOUND 1/2 X4 (GAUZE/BANDAGES/DRESSINGS) ×1
CLOTH BEACON ORANGE TIMEOUT ST (SAFETY) ×3 IMPLANT
CONTAINER PREFILL 10% NBF 15ML (MISCELLANEOUS) IMPLANT
DRAPE SHEET LG 3/4 BI-LAMINATE (DRAPES) IMPLANT
DRSG OPSITE POSTOP 4X10 (GAUZE/BANDAGES/DRESSINGS) ×3 IMPLANT
DURAPREP 26ML APPLICATOR (WOUND CARE) ×3 IMPLANT
ELECT REM PT RETURN 9FT ADLT (ELECTROSURGICAL) ×3
ELECTRODE REM PT RTRN 9FT ADLT (ELECTROSURGICAL) ×1 IMPLANT
EXTRACTOR VACUUM M CUP 4 TUBE (SUCTIONS) IMPLANT
EXTRACTOR VACUUM M CUP 4' TUBE (SUCTIONS)
GLOVE BIO SURGEON STRL SZ 6.5 (GLOVE) ×2 IMPLANT
GLOVE BIO SURGEONS STRL SZ 6.5 (GLOVE) ×1
GOWN STRL REUS W/TWL LRG LVL3 (GOWN DISPOSABLE) ×6 IMPLANT
KIT ABG SYR 3ML LUER SLIP (SYRINGE) IMPLANT
NDL HYPO 25X5/8 SAFETYGLIDE (NEEDLE) IMPLANT
NEEDLE HYPO 25X5/8 SAFETYGLIDE (NEEDLE) IMPLANT
NS IRRIG 1000ML POUR BTL (IV SOLUTION) ×3 IMPLANT
PACK C SECTION WH (CUSTOM PROCEDURE TRAY) ×3 IMPLANT
PAD OB MATERNITY 4.3X12.25 (PERSONAL CARE ITEMS) ×3 IMPLANT
RTRCTR C-SECT PINK 25CM LRG (MISCELLANEOUS) ×3 IMPLANT
STAPLER VISISTAT 35W (STAPLE) IMPLANT
STRIP CLOSURE SKIN 1/2X4 (GAUZE/BANDAGES/DRESSINGS) ×1 IMPLANT
SUT MNCRL 0 VIOLET CTX 36 (SUTURE) ×2 IMPLANT
SUT MONOCRYL 0 CTX 36 (SUTURE) ×4
SUT PLAIN 1 NONE 54 (SUTURE) IMPLANT
SUT PLAIN 2 0 XLH (SUTURE) ×3 IMPLANT
SUT VIC AB 0 CT1 27 (SUTURE) ×15
SUT VIC AB 0 CT1 27XBRD ANBCTR (SUTURE) ×5 IMPLANT
SUT VIC AB 2-0 CT1 27 (SUTURE) ×3
SUT VIC AB 2-0 CT1 TAPERPNT 27 (SUTURE) ×1 IMPLANT
SUT VIC AB 3-0 SH 27 (SUTURE) ×3
SUT VIC AB 3-0 SH 27X BRD (SUTURE) ×1 IMPLANT
SUT VIC AB 4-0 KS 27 (SUTURE) ×2 IMPLANT
SYR BULB IRRIGATION 50ML (SYRINGE) ×3 IMPLANT
TOWEL OR 17X24 6PK STRL BLUE (TOWEL DISPOSABLE) ×3 IMPLANT
TRAY FOLEY CATH 14FR (SET/KITS/TRAYS/PACK) ×3 IMPLANT
WATER STERILE IRR 1000ML POUR (IV SOLUTION) ×3 IMPLANT

## 2014-04-01 NOTE — Brief Op Note (Signed)
04/01/2014  12:27 PM  PATIENT:  Carla [name]  23 y.o. female  PRE-OPERATIVE DIAGNOSIS:  Repeat Cesarean Section, history of Neoplasm of Right ovary  POST-OPERATIVE DIAGNOSIS:  Repeat Cesarean Section, history of Neoplasm of Right ovary  PROCEDURE:  Procedure(s): CESAREAN SECTION, with Right Salpingo Oophorectomy (Right)  SURGEON:  Surgeon(s) and Role:    * Janyth Contes, MD - Primary    * Logan Bores, MD - Assisting  ANESTHESIA:   spinal  EBL:  Total I/O In: 2200 [I.V.:2200] Out: 800 [Urine:100; Blood:700]  BLOOD ADMINISTERED:none  DRAINS: Urinary Catheter (Foley)   LOCAL MEDICATIONS USED:  NONE  SPECIMEN:  Source of Specimen:  RSO and Placenta  DISPOSITION OF SPECIMEN:  Pathology and L&D  COUNTS:  YES  TOURNIQUET:  * No tourniquets in log *  DICTATION: .Other Dictation: Dictation Number [id number]  PLAN OF CARE: Admit to inpatient   PATIENT DISPOSITION:  PACU - hemodynamically stable.   Delay start of Pharmacological VTE agent (>24hrs) due to surgical blood loss or risk of bleeding: not applicable

## 2014-04-01 NOTE — Plan of Care (Signed)
Problem: Phase I Progression Outcomes Goal: Foley catheter patent Outcome: Completed/Met Date Met:  04/01/14 Goal: IS, TCDB as ordered Outcome: Completed/Met Date Met:  04/01/14     

## 2014-04-01 NOTE — Interval H&P Note (Signed)
History and Physical Interval Note:  04/01/2014 10:23 AM  Carla [name]  has presented today for surgery, with the diagnosis of Repeat C/Section, Neoplasm of R ovary, [id number], [phone]  The various methods of treatment have been discussed with the patient and family. After consideration of risks, benefits and other options for treatment, the patient has consented to  Procedure(s): CESAREAN SECTION, with Right Salpingo Oophorectomy (Right) as a surgical intervention .  The patient's history has been reviewed, patient examined, no change in status, stable for surgery.  I have reviewed the patient's chart and labs.  Questions were answered to the patient's satisfaction.     Bovard-Stuckert, Ethylene Reznick

## 2014-04-01 NOTE — Anesthesia Postprocedure Evaluation (Signed)
  Anesthesia Post-op Note  Patient: Carla [name]  Procedure(s) Performed: Procedure(s): CESAREAN SECTION, with Right Salpingo Oophorectomy (Right)  Patient Location: PACU  Anesthesia Type:Spinal  Level of Consciousness: awake, alert  and oriented  Airway and Oxygen Therapy: Patient Spontanous Breathing  Post-op Pain: mild  Post-op Assessment: Post-op Vital signs reviewed, Patient's Cardiovascular Status Stable, Respiratory Function Stable, Patent Airway, No signs of Nausea or vomiting, Pain level controlled, No headache, No backache, No residual numbness and No residual motor weakness  Post-op Vital Signs: Reviewed and stable  Last Vitals:  Filed Vitals:   04/01/14 1245  BP: 97/60  Pulse: 70  Temp:   Resp: 17    Complications: No apparent anesthesia complications

## 2014-04-01 NOTE — Anesthesia Preprocedure Evaluation (Signed)
Anesthesia Evaluation  Patient identified by MRN, date of birth, ID band Patient awake    Reviewed: Allergy & Precautions, H&P , Patient's Chart, lab work & pertinent test results  Airway Mallampati: II  TM Distance: >3 FB Neck ROM: full    Dental no notable dental hx.    Pulmonary  breath sounds clear to auscultation  Pulmonary exam normal       Cardiovascular Exercise Tolerance: Good hypertension, Rhythm:regular Rate:Normal     Neuro/Psych    GI/Hepatic   Endo/Other    Renal/GU      Musculoskeletal   Abdominal   Peds  Hematology   Anesthesia Other Findings No Heelp; well recently  Reproductive/Obstetrics                             Anesthesia Physical Anesthesia Plan  ASA: II  Anesthesia Plan: Spinal   Post-op Pain Management:    Induction:   Airway Management Planned:   Additional Equipment:   Intra-op Plan:   Post-operative Plan:   Informed Consent: I have reviewed the patients History and Physical, chart, labs and discussed the procedure including the risks, benefits and alternatives for the proposed anesthesia with the patient or authorized representative who has indicated his/her understanding and acceptance.   Dental Advisory Given  Plan Discussed with: CRNA  Anesthesia Plan Comments: (Lab work confirmed with CRNA in room. Platelets okay. Discussed spinal anesthetic, and patient consents to the procedure:  included risk of possible headache,backache, failed block, allergic reaction, and nerve injury. This patient was asked if she had any questions or concerns before the procedure started. )        Anesthesia Quick Evaluation

## 2014-04-01 NOTE — Plan of Care (Signed)
Problem: Phase I Progression Outcomes Goal: Pain controlled with appropriate interventions Outcome: Completed/Met Date Met:  04/01/14 Goal: VS, stable, temp < 100.4 degrees F Outcome: Completed/Met Date Met:  04/01/14 Goal: Other Phase I Outcomes/Goals Outcome: Completed/Met Date Met:  04/01/14  Problem: Phase II Progression Outcomes Goal: Tolerating diet Outcome: Completed/Met Date Met:  04/01/14  Problem: Discharge Progression Outcomes Goal: Discharge plan in place and appropriate Outcome: Completed/Met Date Met:  04/01/14

## 2014-04-01 NOTE — Plan of Care (Signed)
Problem: Phase I Progression Outcomes Goal: OOB as tolerated unless otherwise ordered Outcome: Completed/Met Date Met:  04/01/14 Goal: Initial discharge plan identified Outcome: Completed/Met Date Met:  04/01/14

## 2014-04-01 NOTE — Lactation Note (Signed)
This note was copied from the chart of [name]. Lactation Consultation Note  P3, Mother did not breastfeed first child, second child was in NICU and mother pumped for 3 months. She has been taught hand expression by [employer] and attempted latching in football hold but baby is sleepy. Baby is sleeping in crib. Mom encouraged to feed baby 8-12 times/24 hours and with feeding cues. Reviewed cluster feeding and watching for cues. Mom made aware of O/P services, breastfeeding support groups, community resources, and our phone # for post-discharge questions.    Patient Name: [name] Date: 04/01/2014 Reason for consult: Initial assessment   Maternal Data Has patient been taught Hand Expression?: Yes Does the patient have breastfeeding experience prior to this delivery?: No (Pumped for 3 months with 2nd NICU baby)  Feeding Feeding Type: Breast Fed Length of feed: 10 min  LATCH Score/Interventions Latch: Repeated attempts needed to sustain latch, nipple held in mouth throughout feeding, stimulation needed to elicit sucking reflex.  Audible Swallowing: A few with stimulation Intervention(s): Skin to skin  Type of Nipple: Everted at rest and after stimulation  Comfort (Breast/Nipple): Soft / non-tender     Hold (Positioning): Assistance needed to correctly position infant at breast and maintain latch.  LATCH Score: 7  Lactation Tools Discussed/Used     Consult Status Consult Status: Follow-up Date: 04/02/14 Follow-up type: In-patient    Vivianne Master [facility] 04/01/2014, 7:08 PM

## 2014-04-01 NOTE — Transfer of Care (Signed)
Immediate Anesthesia Transfer of Care Note  Patient: Carla [name]  Procedure(s) Performed: Procedure(s): CESAREAN SECTION, with Right Salpingo Oophorectomy (Right)  Patient Location: PACU  Anesthesia Type:Spinal  Level of Consciousness: awake, alert , oriented and patient cooperative  Airway & Oxygen Therapy: Patient Spontanous Breathing  Post-op Assessment: Report given to PACU RN and Post -op Vital signs reviewed and stable  Post vital signs: Reviewed and stable  Complications: No apparent anesthesia complications

## 2014-04-01 NOTE — Anesthesia Procedure Notes (Signed)
Spinal Patient location during procedure: OR Preanesthetic Checklist Completed: patient identified, site marked, surgical consent, pre-op evaluation, timeout performed, IV checked, risks and benefits discussed and monitors and equipment checked Spinal Block Patient position: sitting Prep: DuraPrep Patient monitoring: heart rate, cardiac monitor, continuous pulse ox and blood pressure Approach: midline Location: L3-4 Injection technique: single-shot Needle Needle type: Sprotte  Needle gauge: 24 G Needle length: 9 cm Assessment Sensory level: T4 Additional Notes Spinal Dosage in OR  Bupivicaine ml       1.6 PFMS04   mcg        100 Fentanyl mcg            25

## 2014-04-01 NOTE — Consult Note (Signed)
Neonatology Note:   Attendance at C-section:    I was asked by Dr. Sandford Craze to attend this repeat C/S at term. The mother is a G3P2 B pos, GBS neg with an uncomplicated pregnancy. ROM at delivery, fluid clear. Infant vigorous with good spontaneous cry and tone. Needed no suctioning. Ap 9/9. Lungs clear to ausc in DR. To CN to care of Pediatrician.   Real Cons, MD

## 2014-04-02 ENCOUNTER — Encounter (HOSPITAL_COMMUNITY): Payer: Self-pay | Admitting: Obstetrics and Gynecology

## 2014-04-02 LAB — CBC
HEMATOCRIT: 33.5 % — AB (ref 36.0–46.0)
Hemoglobin: 10.9 g/dL — ABNORMAL LOW (ref 12.0–15.0)
MCH: 27 pg (ref 26.0–34.0)
MCHC: 32.5 g/dL (ref 30.0–36.0)
MCV: 82.9 fL (ref 78.0–100.0)
Platelets: 209 10*3/uL (ref 150–400)
RBC: 4.04 MIL/uL (ref 3.87–5.11)
RDW: 14 % (ref 11.5–15.5)
WBC: 9.3 10*3/uL (ref 4.0–10.5)

## 2014-04-02 NOTE — Plan of Care (Signed)
Problem: Phase I Progression Outcomes Goal: Voiding adequately Outcome: Completed/Met Date Met:  04/02/14  Problem: Phase II Progression Outcomes Goal: Progress activity as tolerated unless otherwise ordered Outcome: Completed/Met Date Met:  04/02/14 Goal: Afebrile, VS remain stable Outcome: Completed/Met Date Met:  04/02/14

## 2014-04-02 NOTE — Progress Notes (Signed)
Subjective: Postpartum Day 1: Cesarean Delivery Patient reports incisional pain and tolerating PO.  Nl lochia, pain controlled  Objective: Vital signs in last 24 hours: Temp:  [97.4 F (36.3 C)-98.5 F (36.9 C)] 97.8 F (36.6 C) (12/01 0640) Pulse Rate:  [58-82] 62 (12/01 0640) Resp:  [14-22] 18 (12/01 0640) BP: (97-126)/(46-70) 116/58 mmHg (12/01 0640) SpO2:  [97 %-100 %] 98 % (12/01 0640) Weight:  [89.812 kg (198 lb)] 89.812 kg (198 lb) (11/30 1352)  Physical Exam:  General: alert and no distress Lochia: appropriate Uterine Fundus: firm Incision: healing well DVT Evaluation: No evidence of DVT seen on physical exam.   Recent Labs  04/02/14 0605  HGB 10.9*  HCT 33.5*    Assessment/Plan: Status post Cesarean section. Doing well postoperatively.  Continue current care.  Bovard-Stuckert, Draya Felker 04/02/2014, 7:34 AM

## 2014-04-02 NOTE — Addendum Note (Signed)
Addendum  created 04/02/14 0748 by Vernice Jefferson, CRNA   Modules edited: Notes Section   Notes Section:  File: [id number]

## 2014-04-02 NOTE — Lactation Note (Signed)
This note was copied from the chart of Carla Janat Tabbert. Lactation Consultation Note Mom feels that BF is going well but RN brought comfort gels to mom related to nipple pain.  Discussed positioning with mom to aid decreasing pain and obtaining a deeper latch.  Follow-up tomorrow.  Patient Name: Carla [name] [id number] Date: 04/02/2014     Maternal Data    Feeding    LATCH Score/Interventions                      Lactation Tools Discussed/Used     Consult Status      Van Clines 04/02/2014, 11:09 PM

## 2014-04-02 NOTE — Plan of Care (Signed)
Problem: Phase II Progression Outcomes Goal: Pain controlled on oral analgesia Outcome: Completed/Met Date Met:  04/02/14     

## 2014-04-02 NOTE — Anesthesia Postprocedure Evaluation (Signed)
  Anesthesia Post-op Note  Patient: Carla [name]  Procedure(s) Performed: Procedure(s): CESAREAN SECTION, with Right Salpingo Oophorectomy (Right)  Patient Location: Mother/Baby  Anesthesia Type:Spinal  Level of Consciousness: awake, alert  and oriented  Airway and Oxygen Therapy: Patient Spontanous Breathing  Post-op Pain: none  Post-op Assessment: Post-op Vital signs reviewed and Patient's Cardiovascular Status Stable  Post-op Vital Signs: Reviewed and stable  Last Vitals:  Filed Vitals:   04/02/14 0640  BP: 116/58  Pulse: 62  Temp: 36.6 C  Resp: 18    Complications: No apparent anesthesia complications

## 2014-04-02 NOTE — Op Note (Signed)
[name], [name]            ACCOUNT NO.:  [id number]  MEDICAL RECORD NO.:  [id number]  LOCATION:                                 FACILITY:  PHYSICIAN:  Thornell Sartorius, MD        DATE OF BIRTH:  05/27/1990  DATE OF PROCEDURE:  04/01/2014 DATE OF DISCHARGE:                              OPERATIVE REPORT   PREOPERATIVE DIAGNOSES: 1. Intrauterine pregnancy at term. 2. History of low-transverse cesarean section, declined vaginal birth     after cesarean. 3. 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 4. Salpingo-oophorectomy per Gyn/Onc recommendation.  POSTOPERATIVE DIAGNOSES: 1. Intrauterine pregnancy at term. 2. History of low-transverse cesarean section, declined vaginal birth     after cesarean. 3. 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 4. Salpingo-oophorectomy per Gyn/Onc recommendation, delivery with     right salpingo-oophorectomy.  SURGEON:  Thornell Sartorius, MD.  ASSISTANT:  Paula Compton, M.D.  ANESTHESIA:  Spinal.  IV FLUIDS:  2200 mL.  URINE OUTPUT:  100 mL of clear urine at the end of procedure.  EBL:  700 mL.  COMPLICATIONS:  Cervical extension on the patient left with Pathology and RSO to L and D, placenta to L and D.  PROCEDURE:  After informed consent, including risks, benefits, and alternatives were discussed with the patient and her partner, she was transported to the operating room, placed on the table.  Spinal anesthesia was induced and found to be adequate.  She was then returned to supine position with a leftward tilt.  Once the spinal was noted to be adequate, she was prepped and draped in the normal sterile fashion. Pfannenstiel skin incision was made at the level of her previous incision, carried through the underlying layer of fascia sharply.  The fascia was incised in the midline.  The incision was extended laterally with Mayo scissors.  The superior aspect of the fascial incision  was grasped with Kocher clamps, elevated, and the rectus muscles were dissected off both bluntly and sharply.  The inferior aspect of the fascial incision was also grasped with Kocher clamps, elevated and the rectus muscles were dissected off both bluntly and sharply.  The midline was easily identified and the peritoneum was entered bluntly.  The incision was extended superiorly and inferiorly with good visualization of the bladder.  Some omental to peritoneal adhesions were noted.  These were reduced with Bovie cautery and noted to be hemostatic.  Alexis skin retractor was placed carefully making sure no bowel was entrapped.  The uterus was then inspected and infant was found to be in a vertex presentation.  The infant was delivered after a transverse incision was made from vertex presentation.  Nose and mouth were suctioned on the field.  Cord was clamped and cut.  Infant was handed off to the awaiting pediatric staff.  The uterus was cleared of all clot and debris.  The uterine incision was closed with 2 layers of 0-Monocryl, the 1st of which was running locked and the 2nd as an imbricating layer.  An additional suture of 0-Vicryl was used to repair the extension, which was noted to be hemostatic.  The left  tube and ovary were inspected and found to be within normal limits.  Some density at the tail of the left ovary.  The right ovary also appeared to be normal as was the tube, this was excised using Claiborne Billings and doubly ligating with 0-Vicryl.  The tube and ovary were sent to Pathology and was noted to be hemostatic.  The peritoneum was reapproximated using 2-0 Vicryl.  The subfascial planes were inspected and found to be hemostatic.  The fascia was closed with 0- Vicryl running, overlapping in the midline.  The subcutaneous tissue was made hemostatic with Bovie cautery.  The dead space was closed with 3-0 plain gut.  The skin was closed with 4-0 Vicryl in a subcuticular fashion with a  Lanny Hurst needle.  Sponge, lap, and needle counts were correct x2 per the operating staff.  Benzoin and Steri-Strips were applied.  The patient tolerated the procedure well.     Thornell Sartorius, MD     JB/MEDQ  D:  04/01/2014  T:  04/01/2014  Job:  [id number]

## 2014-04-02 NOTE — Progress Notes (Signed)
Clinical Social Work Department PSYCHOSOCIAL ASSESSMENT - MATERNAL/CHILD 04/02/2014  Patient:  Carla [name], Carla [name]  Account Number:  [id number]  Admit Date:  04/01/2014  Ardine Eng Name:   Corwin Levins Goins   Clinical Social Worker:  Lucita Ferrara, CLINICAL SOCIAL WORKER   Date/Time:  04/02/2014 10:45 AM  Date Referred:  04/01/2014   Referral source  Central Nursery     Referred reason  Depression/Anxiety   Other referral source:    I:  FAMILY / [place] legal guardian:  PARENT  Guardian - Name Guardian - Age Guardian - Address  Masiya Claassen [address] [place], [place] [address]  Newt Lukes  same as above   Other household support members/support persons Name Relationship DOB  Illa Level 2009  Aiden [place] 2012   Other support:   MOB reported that she has friends who are supportive. She identified the FOB as her primary support.    II  PSYCHOSOCIAL DATA Information Source:  Patient Interview  Occupational hygienist Employment:   MOB stated that she lost her job during the pregnancy.  She shared that the FOB is employed.   Financial resources:  Medicaid If Medicaid - County:  [place] / Grade:  N/A Music therapist / Child Services Coordination / Early Interventions:   None reported  Cultural issues impacting care:   None reported    III  STRENGTHS Strengths  Adequate Resources  Home prepared for Child (including basic supplies)  Supportive family/friends   Strength comment:  MOB was easily engaged and openly discussed her mental health and sources of anxiety with CSW.   IV  RISK FACTORS AND CURRENT PROBLEMS Current Problem:  YES   Risk Factor & Current Problem Patient Issue Family Issue Risk Factor / Current Problem Comment  Mental Illness Y N MOB presents with history of anxiety and depression.  She denied treatment, but acknowledged that depression and anxiety  are a presenting problem.    V  SOCIAL WORK ASSESSMENT CSW met with the MOB due to her history of depression and anxiety.  MOB presented as easily engaged, was receptive to CSW, and expressed gratitude for the visit.  She displayed an appropriate range in affect and presented in a pleasant mood.  She openly discussed her mental health history and presented with self-awareness related to her triggers, distorted thought process, and need to treat her symptoms.  MOB was observed to be attending to and bonding with the baby, and she did not present with any acute mental health symptoms.   CSW assisted the MOB to process her thoughts and feelings as she transitions into the postpartum period.  She expressed excitement upon the birth of her daughter since she has 2 sons.  MOB smiled and appeared proud as she shared with the CSW information about her other two children.  She expressed that she is overwhelmed since her two year old is "very active", and she will be home alone frequently in the postpartum period.  She discussed that she has the support of the FOB, but he is working.  She acknowledged other support people, and expressed intention to utilize her support in upcoming weeks.  The MOB reflected upon goal of securing employment "as soon as possible".  She stated that she lost her job during the pregnancy and was unable to obtain new work once she started "to show".  She expressed stress secondary to financial stress, and also expressed the  challenges she faced since she did not like staying at home.  She shared that it is difficult for her to stay at home since she feels isolated and unproductive.   MOB created a goal for herself to engage in more daily self-care and to reach out to her friends so that she has less time in the home.  She acknowledged that she has the potential to feel isolated in upcoming weeks, and discussed intentions to be pro-active by making plans with her friends to get her out of the  house.   The MOB continued to process her anxiety and depression. She reported that she has experienced symptoms for 3 1/2 years, and stated that she continued to experience symptoms during her pregnancy. She endorsed racing thoughts, chest pain, and panic attacks that were triggered by feeling that "there is too much to do" or if she believes that the FOB did not carry out household tasks to the level that she would if she had done the task herself.  She also reported low motivation/energy and limited desire to engage in daily self-care during her pregnancy since she was depressed about lack of employment and feeling isolated.  She acknowledged receiving a referral from her [facility] nurse, but stated that she did not follow-up with the referral since the symptoms began to resolve.    CSW continued to explore with the MOB her level of readiness to treat her mental health symptoms.  She presents with insight that her anxiety and depression need to be treated.  She shared that she does not like feeling depressed or anxious, as she reflected upon the negative consequences of untreated symptoms. She acknowledged that it increases stress in her relationship with the FOB and does not allow her to be the mother she wants to be.  She presents with motivation to participate in therapy, but is resistant to medication at this time due to history of watching others experiencing negative outcomes secondary to medications.  As CSW explored with the MOB her thought processes, she verbalized awareness that her thoughts are often irrational.  She expressed goal of disengaging from her anxious thoughts, but she lacks ability to do so at this time.  She acknowledges that she has limited evidence to support her anxious thought, but she continues to hold her belief that she needs to accomplish all of her daily household tasks and the FOB is unable to do as a good as a job as she is.  She will continue to require assistance to  de-catastrophize her anxieties, as she continues to believe that there will be severe negative consequences if she allows the FOB to assist her more around the house.   No barriers to discharge.   VI SOCIAL WORK PLAN Social Work Secretary/administrator Education  Information/Referral to [employer]  No Further Intervention Required / No Barriers to Discharge   Type of pt/family education:   Postpartum depression   If child protective services report - county:   If child protective services report - date:   Information/referral to community resources comment:   CSW provided the MOB with a list of mental health therapy referrals and MOB expressed appreciation and intention to follow-up with [employer].    Other social work plan:   CSW to follow-up PRN.

## 2014-04-03 NOTE — Lactation Note (Signed)
This note was copied from the chart of Carla Demaya Hardge. Lactation Consultation Note  Follow up visit done.  Mom states baby is nursing well on right but some difficulty with left.  She is wearing comfort gels for nipple soreness.  Encouraged mom to call for latch assist.  Mom also asking about baby falling asleep at breast.  Reviewed waking techniques.  Patient Name: Carla [name]'B Date: 04/03/2014     Maternal Data    Feeding Feeding Type: Breast Fed Length of feed: 30 min  LATCH Score/Interventions Latch: Grasps breast easily, tongue down, lips flanged, rhythmical sucking. Intervention(s): Adjust position;Assist with latch  Audible Swallowing: A few with stimulation Intervention(s): Skin to skin;Hand expression  Type of Nipple: Everted at rest and after stimulation  Comfort (Breast/Nipple): Filling, red/small blisters or bruises, mild/mod discomfort  Problem noted: Mild/Moderate discomfort  Hold (Positioning): Assistance needed to correctly position infant at breast and maintain latch.  LATCH Score: 7  Lactation Tools Discussed/Used     Consult Status      Ave Filter 04/03/2014, 4:07 PM

## 2014-04-03 NOTE — Plan of Care (Signed)
Problem: Discharge Progression Outcomes Goal: Tolerating diet Outcome: Completed/Met Date Met:  02/03/48 Goal: Complications resolved/controlled Outcome: Completed/Met Date Met:  04/03/14 Goal: Pain controlled with appropriate interventions Outcome: Completed/Met Date Met:  04/03/14

## 2014-04-03 NOTE — Progress Notes (Signed)
Subjective: Postpartum Day 2: Cesarean Delivery Patient reports incisional pain and tolerating PO. Nl lochia, pain controlled   Objective: Vital signs in last 24 hours: Temp:  [97.8 F (36.6 C)-98.7 F (37.1 C)] 98.5 F (36.9 C) (12/02 0510) Pulse Rate:  [63-75] 63 (12/02 0510) Resp:  [18] 18 (12/02 0510) BP: (106-135)/(56-77) 118/71 mmHg (12/02 0510) SpO2:  [99 %] 99 % (12/02 0510)  Physical Exam:  General: alert and no distress Lochia: appropriate Uterine Fundus: firm Incision: healing well DVT Evaluation: No evidence of DVT seen on physical exam.   Recent Labs  04/02/14 0605  HGB 10.9*  HCT 33.5*    Assessment/Plan: Status post Cesarean section. Doing well postoperatively.  Continue current care.  Bovard-Stuckert, Carla [name] 04/03/2014, 7:42 AM

## 2014-04-04 MED ORDER — PRENATAL MULTIVITAMIN CH: 1.0000 | ORAL_TABLET | Freq: Every day | ORAL | Status: DC

## 2014-04-04 MED ORDER — IBUPROFEN 800 MG PO TABS: 800.0000 mg | ORAL_TABLET | Freq: Three times a day (TID) | ORAL | Status: DC

## 2014-04-04 MED ORDER — OXYCODONE-ACETAMINOPHEN 5-325 MG PO TABS: 1.0000 | ORAL_TABLET | Freq: Four times a day (QID) | ORAL | Status: DC | PRN

## 2014-04-04 NOTE — Discharge Summary (Signed)
Obstetric Discharge Summary Reason for Admission: cesarean section Prenatal Procedures: none Intrapartum Procedures: cesarean: low cervical, transverse Postpartum Procedures: none Complications-Operative and Postpartum: none HEMOGLOBIN  Date Value Ref Range Status  04/02/2014 10.9* 12.0 - 15.0 g/dL Final   HCT  Date Value Ref Range Status  04/02/2014 33.5* 36.0 - 46.0 % Final    Physical Exam:  General: alert and no distress Lochia: appropriate Uterine Fundus: firm Incision: healing well DVT Evaluation: No evidence of DVT seen on physical exam.  Discharge Diagnoses: Term Pregnancy-delivered  Discharge Information: Date: 04/04/2014 Activity: pelvic rest Diet: routine Medications: PNV, Ibuprofen and Percocet Condition: stable Instructions: refer to practice specific booklet Discharge to: home Follow-up Information    Follow up with Bovard-Stuckert, Ishmael Berkovich, MD. Schedule an appointment as soon as possible for a visit in 2 weeks.   Specialty:  Obstetrics and Gynecology   Why:  for incision check   Contact information:   [address] [address] [phone]       Newborn Data: Live born female  Birth Weight: 6 lb 10.9 oz (3030 g) APGAR: 9, 9  Home with mother.  Bovard-Stuckert, Stanislaus Kaltenbach 04/04/2014, 10:45 AM

## 2014-04-04 NOTE — Progress Notes (Signed)
Subjective: Postpartum Day 3: Cesarean Delivery Patient reports incisional pain and tolerating PO. Nl lochia, pain controlled. D/w pt LMP of ovary, will report to GynOnc   Objective: Vital signs in last 24 hours: Temp:  [98.1 F (36.7 C)-98.3 F (36.8 C)] 98.3 F (36.8 C) (12/03 0631) Pulse Rate:  [70-90] 90 (12/03 0631) Resp:  [18-20] 18 (12/03 0631) BP: (119-140)/(70-96) 124/70 mmHg (12/03 0631)  Physical Exam:  General: alert and no distress Lochia: appropriate Uterine Fundus: firm Incision: healing well DVT Evaluation: No evidence of DVT seen on physical exam.   Recent Labs  04/02/14 0605  HGB 10.9*  HCT 33.5*    Assessment/Plan: Status post Cesarean section. Doing well postoperatively.  Discharge home with standard precautions and return to clinic in 2 weeks.  D/c with Motrin, percocet and PNV.    [name], Carla [name] 04/04/2014, 10:41 AM

## 2014-04-04 NOTE — Plan of Care (Signed)
Problem: Discharge Progression Outcomes Goal: MMR given as ordered Outcome: Completed/Met Date Met:  04/04/14

## 2014-04-04 NOTE — Lactation Note (Signed)
This note was copied from the chart of Carla Davy Faught. Lactation Consultation Note     Follow up consult with this mom of a term baby, now 41 hours old. Mom is getting engorged/very full, has a history of over abundant milk supply. She was given ice, and set up a DEP with 21 size flanges, for her to pump to comfort. Mom 's last baby was a [redacted] week gestation, so she is familiar with pumping. I advised mom to pump to comfort, not until she stops dripping, since the more she pumps, the more she will rpproduce. Mom was breast feeding when I walked in the room, in cradle hold with the baby across her at an angle. i reviewed cross cradle with mom, and sowed her how a deeper latch will protect her nipples. I gave her new comfort gels and instructed her in their use and care. Mom knows to call for questions/conerns.  Patient Name: Carla [name] [id number] Date: 04/04/2014 Reason for consult: Follow-up assessment   Maternal Data    Feeding Feeding Type: Breast Fed Length of feed: 45 min  LATCH Score/Interventions                      Lactation Tools Discussed/Used WIC Program: Yes Pump Review: Setup, frequency, and cleaning;Milk Storage Initiated by:: Travis Date initiated:: 04/04/14   Consult Status      Tonna Corner 04/04/2014, 8:37 AM

## 2014-04-05 ENCOUNTER — Ambulatory Visit (HOSPITAL_COMMUNITY): Payer: Medicaid Other

## 2014-04-30 ENCOUNTER — Ambulatory Visit: Payer: Medicaid Other | Admitting: Neurology

## 2014-05-02 ENCOUNTER — Encounter: Payer: Self-pay | Admitting: Neurology

## 2014-05-07 IMAGING — US US OB TRANSVAGINAL
1 series · 13 of 28 positions shown · non-contrast
Comparison: none

[Series 1: us ob transvaginal · 13 of 53 slices shown]
[im 2/53]
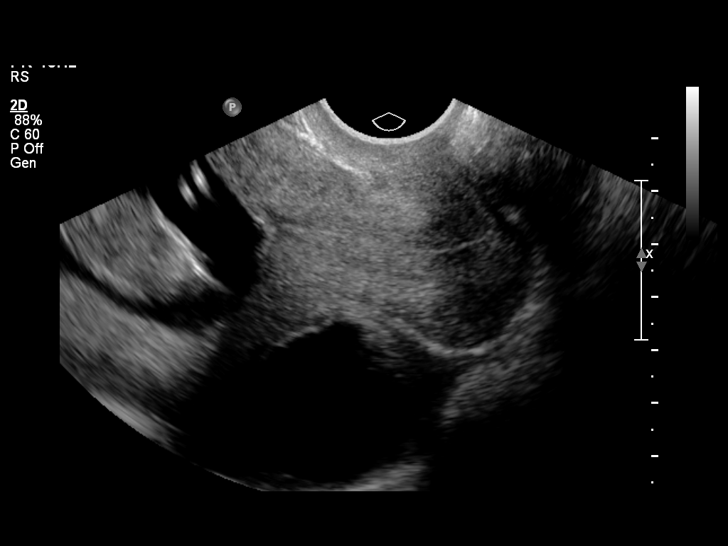
[im 6/53]
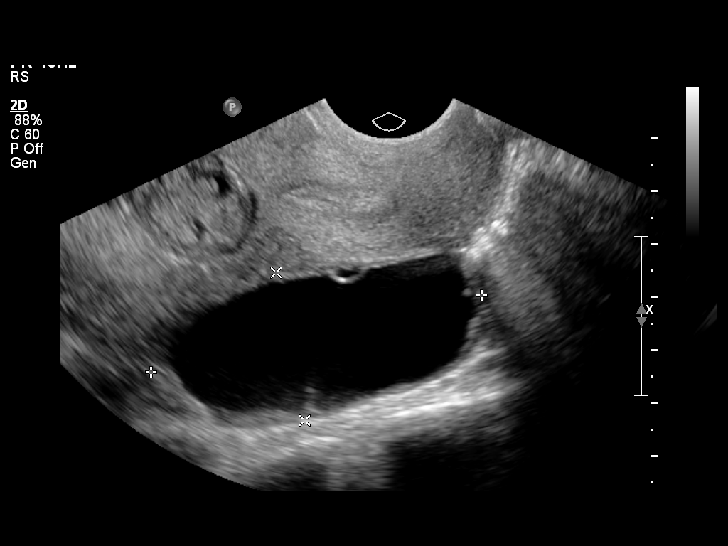
[im 10/53]
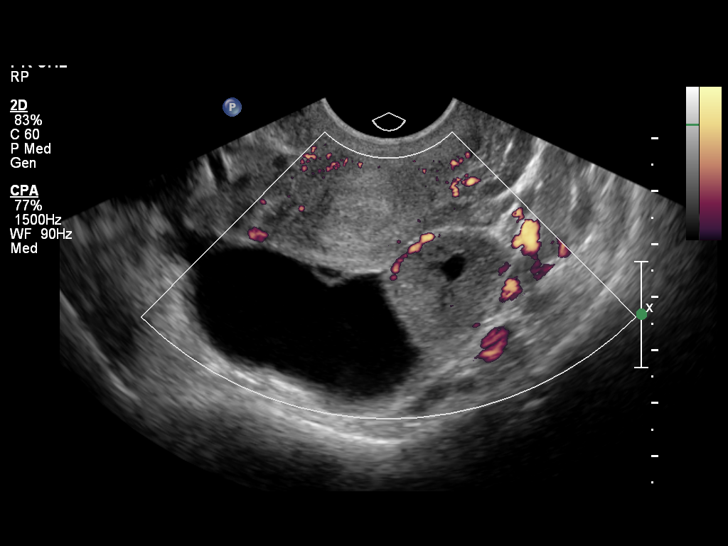
[im 14/53]
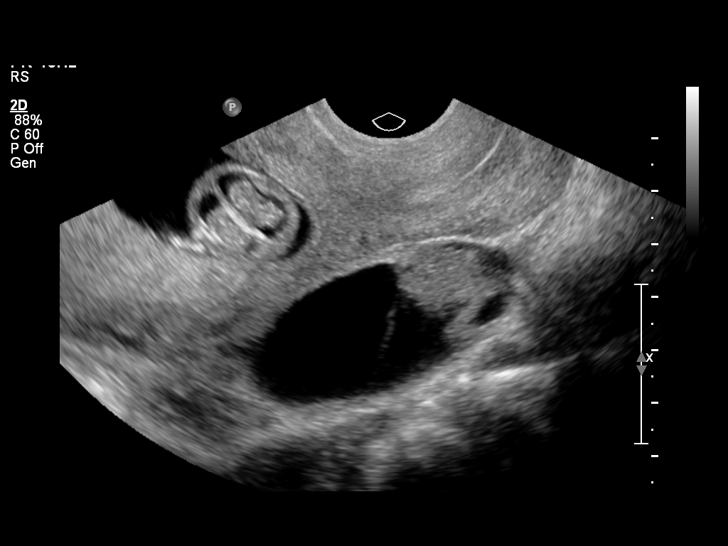
[im 18/53]
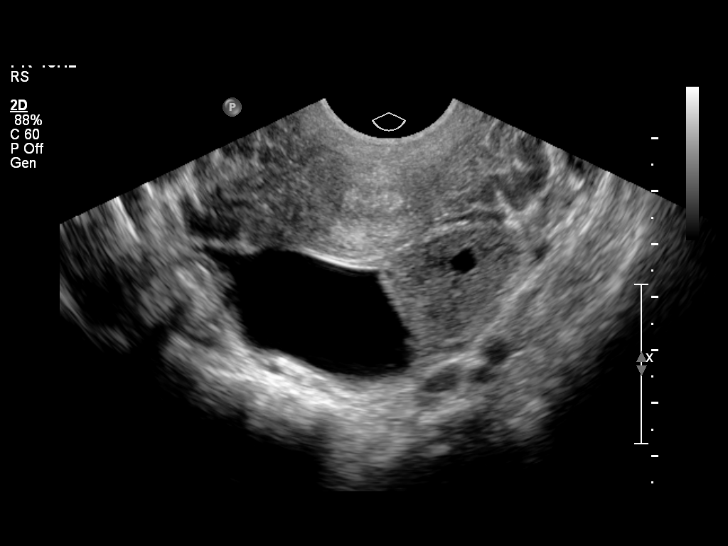
[im 22/53]
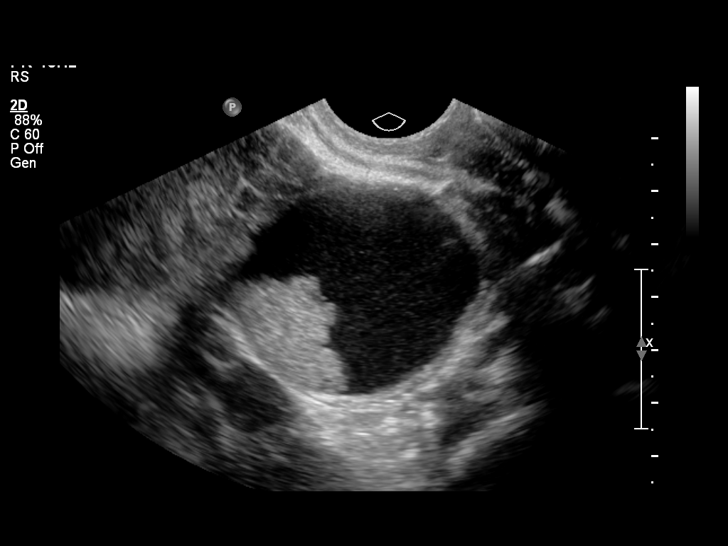
[im 27/53]
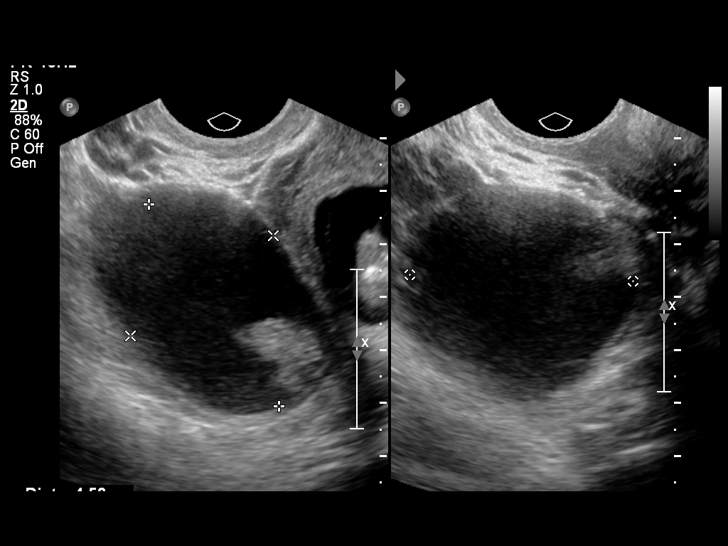
[im 31/53]
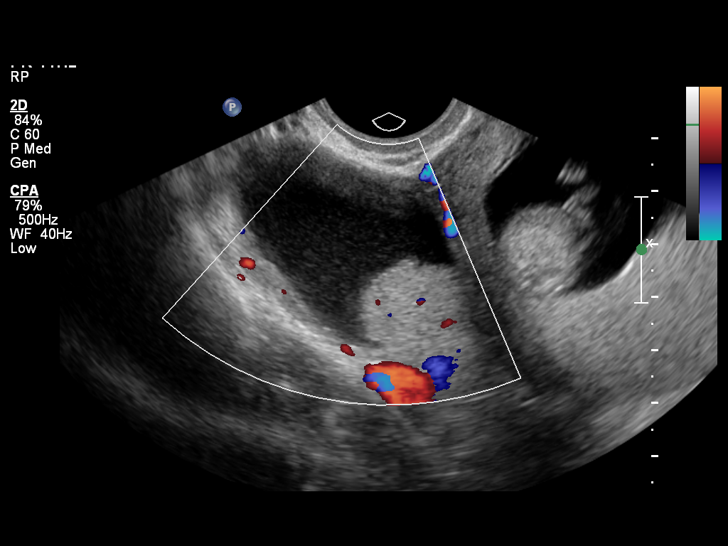
[im 35/53]
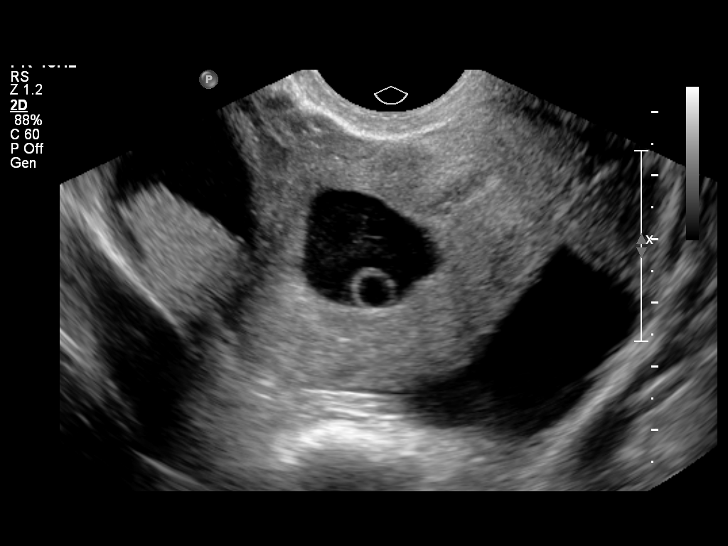
[im 39/53]
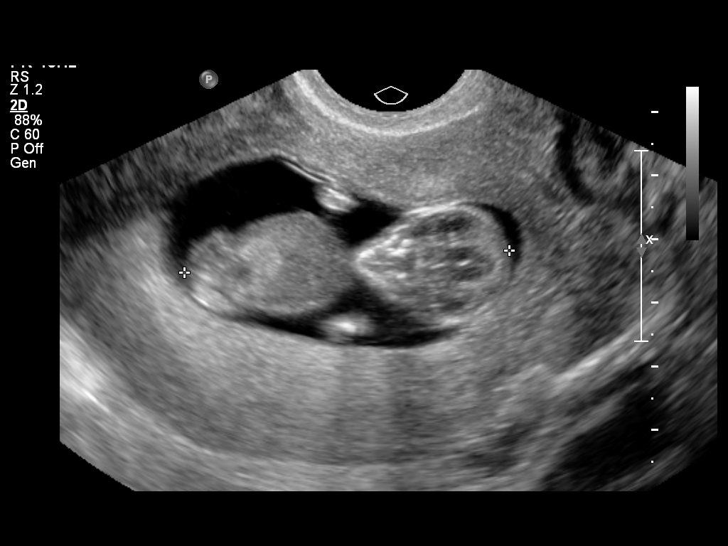
[im 43/53]
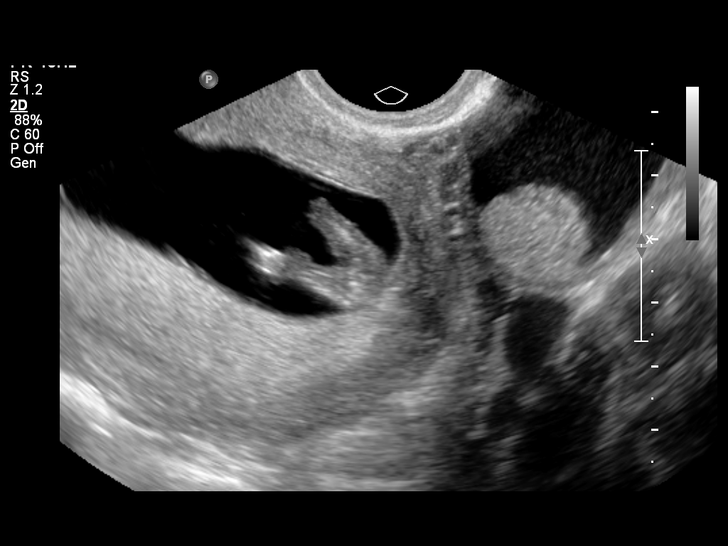
[im 47/53]
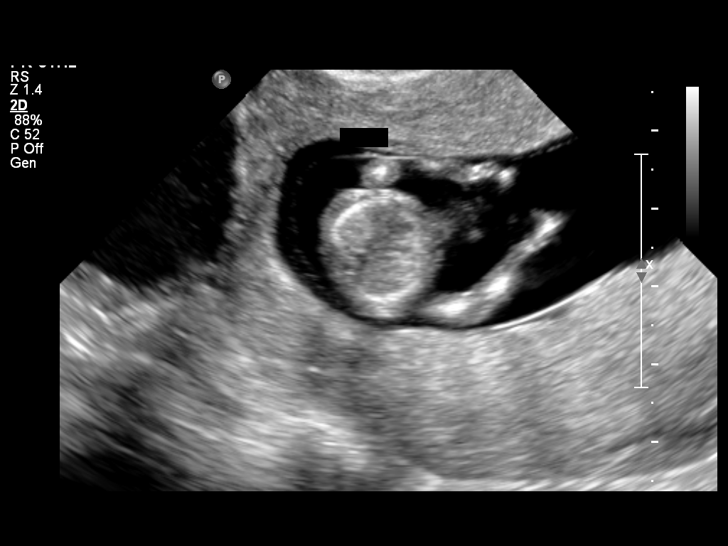
[im 51/53]
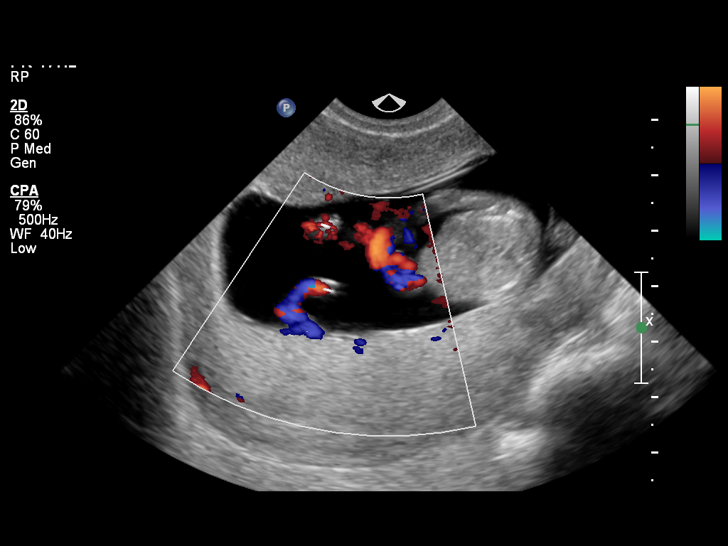

[13 of 28 positions shown; findings below may reference images not displayed]

OBSTETRICS REPORT
                      (Signed Final 09/07/2011 [DATE])

 Order#:         [id number]
Procedures

 US OB TRANSVAGINAL                                    76817.0
Indications

 Vaginal bleeding, unknown etiology
 Pain - RLQ
Fetal Evaluation

 Yolk Sac:          Visualized
 Fetal Heart Rate:  160                         bpm
 Cardiac Activity:  Observed
 Presentation:      Variable
 Placenta:          Posterior
 P. Cord            Visualized
 Insertion:

 Comment:    No subchorionic hemorrhage seen.

 Amniotic Fluid
 AFI FV:      Subjectively within normal limits
Biometry

 CRL:     51.3  mm    G. Age:   11w 5d                 EDD:   03/23/12
Gestational Age

 Best:          11w 4d    Det. By:   Previous Ultrasound      EDD:   03/24/12
                                     (08/06/11)
Anatomy

 Cranium:           Appears normal      Bladder:           Appears normal
 Choroid Plexus:    Appears normal      Limbs:             Four extremities
                                                           seen
 Stomach:           Appears
                    normal, left
                    sided
Cervix Uterus Adnexa
 Cervix:       Normal appearance by transvaginal scan
 Uterus:       No abnormality visualized.
 Left Ovary:   Contains 5.5 x 2.1 x 3.1 cm cystic structure with
               internal septation.
 Right Ovary:  Contains 4.5 x 3.3 x 4.2 complex cystic structure with
               intramural soft tissue nodule.
 Adnexa:     Trace amount free fluid.
 Comment:    Bilateral adnexal cysts are unchanged in size.  Right
             adnexal  complex cyst may be a dermoid.  Left adnexal
             complex cyst may be CLC or hydrosalpix.
Impression

 Single living intrauterine gestation with CRL indicating an
 EGA of  44w4d.  This is concordant with the LMP.  Limited
 anatomic survey due to early gestational age.  Full anatomy
 scan at 18-21 weeks recommended.

 No change in size and appearance of bilateral adnexal
 complex cysts, as above. Please continue to follow on future
 ultrasounds.

## 2014-05-13 ENCOUNTER — Ambulatory Visit: Payer: Self-pay | Admitting: Gynecologic Oncology

## 2014-05-17 ENCOUNTER — Ambulatory Visit: Payer: Medicaid Other | Admitting: Gynecologic Oncology

## 2014-05-17 ENCOUNTER — Telehealth: Payer: Self-pay | Admitting: *Deleted

## 2014-05-17 NOTE — Telephone Encounter (Signed)
Pt called stating she needs to cancel her appt for today. Rescheduled for next Friday, 05/24/14 at 10:30am. Patient agreeable to this new appt date and time.

## 2014-05-24 ENCOUNTER — Ambulatory Visit: Payer: Medicaid Other | Admitting: Gynecologic Oncology

## 2014-05-27 ENCOUNTER — Ambulatory Visit: Payer: Medicaid Other

## 2014-05-27 ENCOUNTER — Encounter: Payer: Self-pay | Admitting: Gynecologic Oncology

## 2014-05-27 ENCOUNTER — Ambulatory Visit: Payer: Medicaid Other | Attending: Gynecologic Oncology | Admitting: Gynecologic Oncology

## 2014-05-27 VITALS — BP 126/58 | HR 56 | Temp 98.2°F | Resp 16 | Ht 65.0 in | Wt 173.9 lb

## 2014-05-27 DIAGNOSIS — C561 Malignant neoplasm of right ovary: Secondary | ICD-10-CM | POA: Diagnosis present

## 2014-05-27 DIAGNOSIS — D0739 Carcinoma in situ of other female genital organs: Secondary | ICD-10-CM

## 2014-05-27 DIAGNOSIS — Z90721 Acquired absence of ovaries, unilateral: Secondary | ICD-10-CM | POA: Diagnosis not present

## 2014-05-27 DIAGNOSIS — D3910 Neoplasm of uncertain behavior of unspecified ovary: Secondary | ICD-10-CM

## 2014-05-27 NOTE — Progress Notes (Signed)
Office Visit:  GYN ONCOLOGY  Chief Complaint: Ovarian LMP  Assessment/Plan:  [name] is a 24 y.o. 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  Patient was lost to follow-up.  She underwent completion RSO in November 2015 at the time of her cesarean section. Final pathology revealed residual LMP in the right ovary.  Recommend: q 6 monthly physical examinations with her gynecologist and CA 125 evaluations. q 12 monthly transvaginal US to evaluate left ovary. Recommend completion hysterectomy/bso when the patient has completed childbearing.   HPI:23 y.o.  G3P2 patient underwent C/S 01/01/2012 for HELLP. Right ovarian cystectomy performed and was notable for serous LMP.  No family history of gyn malignancy.  UTZ without comment on right adnexa. C/S in November 2015 with RSO at time of procedure. Residual LMP identified in the right ovary specimen.  Social Hx: Engaged.  Children doing well.  Satisfied with parity. Engaged, wedding delayed until after delivery  Past Surgical Hx:  Past Surgical History   Procedure  Date   .  No past surgeries    .  Cesarean section  01/01/2012     Procedure: CESAREAN SECTION; Surgeon: Thornell Sartorius, MD; Location: [facility]; Service: Gynecology; Laterality: N/A; Primary cesarean section of baby boy at 50   Past Medical Hx:  Past Medical History  Diagnosis Date  . Pregnancy induced hypertension   . Anxiety   . Neuromuscular disorder     complaints of leg pain since 6th grade  . HELLP syndrome in third trimester 12/31/2011  . S/P emergency cesarean section 01/01/2012  . Pregnancy headache   . S/P cesarean section 04/01/2014  . 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    Review of Systems:  Constitutional  Feels well,    Cardiovascular  No chest pain, shortness of breath, or edema  Pulmonary  No cough or wheeze.  Gastro Intestinal  No nausea, vomitting, or diarrhoea. No bright red blood per rectum, no abdominal pain, no abdominal bloating Genito Urinary  No bleeding or  vaginal discharge  Neurologic  No headache, no lower extremity edema Psychology  Feels well   Physical Exam:  BP 126/58 mmHg  Pulse 56  Temp(Src) 98.2 F (36.8 C) (Oral)  Resp 16  Ht 5\' 5"  (1.651 m)  Wt 173 lb 14.4 oz (78.881 kg)  BMI 28.94 kg/m2  WD in NAD  Neck  Supple NROM, without any enlargements.  Lymph Node Survey  No cervical supraclavicular or inguinal adenopathy  Cardiovascular  Pulse normal rate, regularity and rhythm.  Lungs  Clear to auscultation bilaterally.Abdomen  Normoactive bowel sounds, abdomen soft, non-tender Pfannenstiel incision intact.  No abdominal tenderness. Back  No CVA tenderness  Extremities  No bilateral cyanosis, clubbing or edema.  Genitourinary: Normal external female genitalia, normal BUS and skenes, normal vagina and cervix. Small, mobile uterus. No palpable adnexal masses. Normal anus and rectum.  Donaciano Eva, MD

## 2014-05-28 ENCOUNTER — Telehealth: Payer: Self-pay | Admitting: Gynecologic Oncology

## 2014-05-28 LAB — CA 125(PREVIOUS METHOD): CA 125: 7.8 U/mL (ref 0.0–30.2)

## 2014-05-28 LAB — CA 125: CA 125: 10 U/mL (ref <35)

## 2014-05-28 NOTE — Telephone Encounter (Signed)
Message left for patient with CA 125 results.  Instructed to call for any questions or concerns.

## 2014-05-31 ENCOUNTER — Ambulatory Visit: Payer: Self-pay | Admitting: Gynecologic Oncology

## 2014-10-11 ENCOUNTER — Encounter (HOSPITAL_COMMUNITY): Payer: Self-pay | Admitting: Neurology

## 2014-10-11 ENCOUNTER — Emergency Department (HOSPITAL_COMMUNITY): Payer: Medicaid Other

## 2014-10-11 ENCOUNTER — Emergency Department (HOSPITAL_COMMUNITY)
Admission: EM | Admit: 2014-10-11 | Discharge: 2014-10-11 | Disposition: A | Discharge status: Home / Self Care | Payer: Medicaid Other | Attending: Emergency Medicine | Admitting: Emergency Medicine

## 2014-10-11 DIAGNOSIS — Y9389 Activity, other specified: Secondary | ICD-10-CM | POA: Insufficient documentation

## 2014-10-11 DIAGNOSIS — S199XXA Unspecified injury of neck, initial encounter: Secondary | ICD-10-CM | POA: Diagnosis not present

## 2014-10-11 DIAGNOSIS — Z8669 Personal history of other diseases of the nervous system and sense organs: Secondary | ICD-10-CM | POA: Diagnosis not present

## 2014-10-11 DIAGNOSIS — Z8659 Personal history of other mental and behavioral disorders: Secondary | ICD-10-CM | POA: Diagnosis not present

## 2014-10-11 DIAGNOSIS — Y998 Other external cause status: Secondary | ICD-10-CM | POA: Diagnosis not present

## 2014-10-11 DIAGNOSIS — Z91040 Latex allergy status: Secondary | ICD-10-CM | POA: Diagnosis not present

## 2014-10-11 DIAGNOSIS — S79911A Unspecified injury of right hip, initial encounter: Secondary | ICD-10-CM | POA: Diagnosis not present

## 2014-10-11 DIAGNOSIS — Z3202 Encounter for pregnancy test, result negative: Secondary | ICD-10-CM | POA: Insufficient documentation

## 2014-10-11 DIAGNOSIS — M25551 Pain in right hip: Secondary | ICD-10-CM

## 2014-10-11 DIAGNOSIS — Y92410 Unspecified street and highway as the place of occurrence of the external cause: Secondary | ICD-10-CM | POA: Insufficient documentation

## 2014-10-11 DIAGNOSIS — Z8543 Personal history of malignant neoplasm of ovary: Secondary | ICD-10-CM | POA: Insufficient documentation

## 2014-10-11 LAB — I-STAT BETA HCG BLOOD, ED (MC, WL, AP ONLY): I-stat hCG, quantitative: <5 m[IU]/mL (ref <5)

## 2014-10-11 MED ORDER — IBUPROFEN 600 MG PO TABS: 600.0000 mg | ORAL_TABLET | Freq: Four times a day (QID) | ORAL | Status: DC | PRN

## 2014-10-11 MED ORDER — ONDANSETRON HCL 4 MG/2ML IJ SOLN
4.0000 mg | Freq: Once | INTRAMUSCULAR | Status: AC
  Administered 2014-10-11: 4 mg via INTRAVENOUS
  Filled 2014-10-11: qty 2

## 2014-10-11 MED ORDER — ACETAMINOPHEN 325 MG PO TABS
650.0000 mg | ORAL_TABLET | Freq: Once | ORAL | Status: DC
  Filled 2014-10-11: qty 2

## 2014-10-11 MED ORDER — MORPHINE SULFATE 4 MG/ML IJ SOLN
4.0000 mg | Freq: Once | INTRAMUSCULAR | Status: AC
  Administered 2014-10-11: 4 mg via INTRAVENOUS
  Filled 2014-10-11: qty 1

## 2014-10-11 NOTE — Discharge Instructions (Signed)
If you were given medicines take as directed.  If you are on coumadin or contraceptives realize their levels and effectiveness is altered by many different medicines.  If you have any reaction (rash, tongues swelling, other) to the medicines stop taking and see a physician.    If your blood pressure was elevated in the ER make sure you follow up for management with a primary doctor or return for chest pain, shortness of breath or stroke symptoms.  Please follow up as directed and return to the ER or see a physician for new or worsening symptoms.  Thank you. Filed Vitals:   10/11/14 1143 10/11/14 1145 10/11/14 1200 10/11/14 1230  BP: 117/66 120/71 116/66 129/70  Pulse: 81 77 74 66  Temp:      TempSrc:      Resp: 18     Height:      Weight:      SpO2: 99% 99% 100% 100%
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
## 2014-12-09 ENCOUNTER — Ambulatory Visit: Payer: Medicaid Other | Attending: Orthopedic Surgery

## 2014-12-09 DIAGNOSIS — G5701 Lesion of sciatic nerve, right lower limb: Secondary | ICD-10-CM | POA: Insufficient documentation

## 2014-12-09 DIAGNOSIS — M533 Sacrococcygeal disorders, not elsewhere classified: Secondary | ICD-10-CM | POA: Insufficient documentation

## 2014-12-09 DIAGNOSIS — M549 Dorsalgia, unspecified: Secondary | ICD-10-CM | POA: Insufficient documentation

## 2014-12-09 DIAGNOSIS — R269 Unspecified abnormalities of gait and mobility: Secondary | ICD-10-CM | POA: Insufficient documentation

## 2014-12-09 DIAGNOSIS — G8929 Other chronic pain: Secondary | ICD-10-CM | POA: Insufficient documentation

## 2014-12-23 ENCOUNTER — Ambulatory Visit: Payer: Medicaid Other | Admitting: Physical Therapy

## 2014-12-23 DIAGNOSIS — G5701 Lesion of sciatic nerve, right lower limb: Secondary | ICD-10-CM | POA: Diagnosis present

## 2014-12-23 DIAGNOSIS — G8929 Other chronic pain: Secondary | ICD-10-CM

## 2014-12-23 DIAGNOSIS — M549 Dorsalgia, unspecified: Principal | ICD-10-CM

## 2014-12-23 DIAGNOSIS — M533 Sacrococcygeal disorders, not elsewhere classified: Secondary | ICD-10-CM

## 2014-12-23 DIAGNOSIS — R269 Unspecified abnormalities of gait and mobility: Secondary | ICD-10-CM | POA: Diagnosis present
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
## 2014-12-25 ENCOUNTER — Encounter: Payer: Self-pay | Admitting: Physical Therapy

## 2014-12-31 ENCOUNTER — Ambulatory Visit: Payer: Medicaid Other | Admitting: Physical Therapy

## 2015-01-08 ENCOUNTER — Ambulatory Visit: Payer: Medicaid Other | Attending: Orthopedic Surgery | Admitting: Physical Therapy

## 2015-01-08 DIAGNOSIS — R269 Unspecified abnormalities of gait and mobility: Secondary | ICD-10-CM | POA: Insufficient documentation

## 2015-01-08 DIAGNOSIS — M533 Sacrococcygeal disorders, not elsewhere classified: Secondary | ICD-10-CM | POA: Diagnosis present

## 2015-01-08 DIAGNOSIS — M549 Dorsalgia, unspecified: Secondary | ICD-10-CM | POA: Diagnosis present

## 2015-01-08 DIAGNOSIS — G5701 Lesion of sciatic nerve, right lower limb: Secondary | ICD-10-CM | POA: Diagnosis present

## 2015-01-08 DIAGNOSIS — G8929 Other chronic pain: Secondary | ICD-10-CM | POA: Insufficient documentation

## 2015-01-08 NOTE — Therapy (Signed)
[place], [place], [address] Phone: [phone]   Fax:  [phone]  Physical Therapy Treatment  Patient Details  Name: Carla [name] MRN: [id number] Date of Birth: 1991-05-01 Referring Provider:  Earlie Server, MD  Encounter Date: 01/08/2015      PT End of Session - 01/08/15 1254    Visit Number 2   Number of Visits 12   Date for PT Re-Evaluation 02/03/15   PT Start Time 8242   PT Stop Time 1105   PT Time Calculation (min) 50 min   Activity Tolerance Patient tolerated treatment well;Patient limited by pain   Behavior During Therapy [facility] for tasks assessed/performed;Restless      Past Medical History  Diagnosis Date  . Pregnancy induced hypertension   . Anxiety   . Neuromuscular disorder     complaints of leg pain since 6th grade  . HELLP syndrome in third trimester 12/31/2011  . S/P emergency cesarean section 01/01/2012  . Pregnancy headache   . S/P cesarean section 04/01/2014  . Ovarian neoplasm with low malignant potential 04/01/2014    Past Surgical History  Procedure Laterality Date  . Cesarean section  01/01/2012    Procedure: CESAREAN SECTION;  Surgeon: Thornell Sartorius, MD;  Location: [facility];  Service: Gynecology;  Laterality: N/A;  Primary cesarean section of baby  boy at 28  . Ovarian cystectomy    . Cesarean section Right 04/01/2014    Procedure: CESAREAN SECTION, with Right Salpingo Oophorectomy;  Surgeon: Janyth Contes, MD;  Location: [facility];  Service: Obstetrics;  Laterality: Right;    There were no vitals filed for this visit.  Visit Diagnosis:  Chronic back pain  SI (sacroiliac) joint dysfunction  Piriformis syndrome, right  Abnormality of gait      Subjective Assessment - 01/08/15 1242    Subjective [birth year]/10  Had awlful pain yesterday at work  10/10 yesterday.  Tried her exercises they helped some.  Patient late to session                          [name] Adult PT Treatment/Exercise - 01/08/15 1033    Transfers   Comments scoot hip to side to avoid quadratus pain   Lumbar Exercises: Stretches   Single Knee to Chest Stretch 3 reps;20 seconds   Standing Side Bend 3 reps;10 seconds  added to home    Standing Extension --  1 rep stopped increased Lt leg numbness   Quadruped Mid Back Stretch 1 rep;20 seconds   Quadruped Mid Back Stretch Limitations has knee issues from previous injury   Piriformis Stretch 3 reps;20 seconds  AA, Model used to show muscles   Lumbar Exercises: Supine   Isometric Hip Flexion 3 seconds  3 reps to check SI    Other Supine Lumbar Exercises tried stabilization patient uncomfortable with hip pain in hooklying    Lumbar Exercises: Sidelying   Other Sidelying Lumbar Exercises quadratus stretch over pillow arm overhead.  Lt side helped ease pain   Moist Heat Therapy   Number Minutes Moist Heat 15 Minutes   Moist Heat Location --  rt flank/gluteal/hip stretching over 2 pillows sidelying.                PT Education - 01/08/15 1254    Education provided Yes   Education Details Quardrtus stretch and info , also ADL education /posture   Person(s) Educated Patient   Methods Explanation;Demonstration;Tactile cues;Verbal  cues;Handout   Comprehension Verbalized understanding;Returned demonstration          PT Short Term Goals - 01/08/15 1257    PT SHORT TERM GOAL #1   Title I with initial HEP   Time 2   Period Weeks   Status On-going   PT SHORT TERM GOAL #2   Title Pt. will verbalize and demonstrated self care pain management and positioning including RICE   Time 3   Period Weeks   Status On-going           PT Long Term Goals - 12/23/14 1403    PT LONG TERM GOAL #1   Title Pt. will be I with advanced HEP   Time 6   Period Weeks   Status New   PT LONG TERM GOAL #2   Title Pt. will report s decrease in pain from 7/10 to 3/10 in order to sleep  through the night   Time 6   Period Weeks   Status New   PT LONG TERM GOAL #3   Title Pt will demo normalized gait pattern indicating decreased pain in back   Time 6   Period Weeks   Status New   PT LONG TERM GOAL #4   Title Pt will increase lumbar ROM from 10 degrees to 20 degrees to be [facility]   Time 6   Period Weeks   Status New   PT LONG TERM GOAL #5   Title --   Time --   Period --   Status --   Additional Long Term Goals   Additional Long Term Goals --   PT LONG TERM GOAL #6   Title --   Time --   Period --   Status --               Plan - 01/08/15 1255    Clinical Impression Statement shorter session, late.  Patient pain flared and is now going into both hips intermittantlt.  RT ASIS slightly higher, she could not tolerate much stabilization todat  Stretches were tolerated.  Beginninh education toward ADL/posture.   PT Next Visit Plan assess HEP, pelvic girdle strengthening, stabilization exercises, ice, estim, manual to piriformis, McConnell Tape SI   PT Home Exercise Plan Quadratus lumborum stretch,  Good posture   Consulted and Agree with Plan of Care Patient        Problem List Patient Active Problem List   Diagnosis Date Noted  . S/P cesarean section 04/01/2014  . 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 NST (non-stress test) nonreactive   . [redacted] weeks gestation of pregnancy   . Migraine, unspecified, without mention of intractable migraine without mention of status migrainosus 01/25/2014  . Pregnancy headache   . IUP (intrauterine pregnancy), incidental 08/23/2013  . Ovarian cancer on right 01/20/2012  . S/P emergency cesarean section 01/01/2012  . HELLP syndrome in third trimester 12/31/2011    [facility] 01/08/2015, 12:59 PM  Carla [name] [address] [place], [place], [address] Phone: [phone]   Fax:  [phone]     Carla [name], PTA 01/08/2015 12:59 PM Phone:  [phone] Fax: [phone]

## 2015-01-08 NOTE — Patient Instructions (Addendum)
Sleeping on Back  Place pillow under knees. A pillow with cervical support and a roll around waist are also helpful. Copyright  VHI. All rights reserved.  Sleeping on Side Place pillow between knees. Use cervical support under neck and a roll around waist as needed. Copyright  VHI. All rights reserved.   Sleeping on Stomach   If this is the only desirable sleeping position, place pillow under lower legs, and under stomach or chest as needed.  Posture - Sitting   Sit upright, head facing forward. Try using a roll to support lower back. Keep shoulders relaxed, and avoid rounded back. Keep hips level with knees. Avoid crossing legs for long periods. Stand to Sit / Sit to Stand   To sit: Bend knees to lower self onto front edge of chair, then scoot back on seat. To stand: Reverse sequence by placing one foot forward, and scoot to front of seat. Use rocking motion to stand up.   Work Height and Reach  Ideal work height is no more than 2 to 4 inches below elbow level when standing, and at elbow level when sitting. Reaching should be limited to arm's length, with elbows slightly bent.  Bending  Bend at hips and knees, not back. Keep feet shoulder-width apart.    Posture - Standing   Good posture is important. Avoid slouching and forward head thrust. Maintain curve in low back and align ears over shoul- ders, hips over ankles.  Alternating Positions   Alternate tasks and change positions frequently to reduce fatigue and muscle tension. Take rest breaks. Computer Work   Position work to face forward. Use proper work and seat height. Keep shoulders back and down, wrists straight, and elbows at right angles. Use chair that provides full back support. Add footrest and lumbar roll as needed.  Getting Into / Out of Car  Lower self onto seat, scoot back, then bring in one leg at a time. Reverse sequence to get out.  Dressing  Lie on back to pull socks or slacks over feet, or sit  and bend leg while keeping back straight.    Housework - Sink  Place one foot on ledge of cabinet under sink when standing at sink for prolonged periods.   Pushing / Pulling  Pushing is preferable to pulling. Keep back in proper alignment, and use leg muscles to do the work.  Deep Squat   Squat and lift with both arms held against upper trunk. Tighten stomach muscles without holding breath. Use smooth movements to avoid jerking.  Avoid Twisting   Avoid twisting or bending back. Pivot around using foot movements, and bend at knees if needed when reaching for articles.  Carrying Luggage   Distribute weight evenly on both sides. Use a cart whenever possible. Do not twist trunk. Move body as a unit.   Lifting Principles .Maintain proper posture and head alignment. .Slide object as close as possible before lifting. .Move obstacles out of the way. .Test before lifting; ask for help if too heavy. .Tighten stomach muscles without holding breath. .Use smooth movements; do not jerk. .Use legs to do the work, and pivot with feet. .Distribute the work load symmetrically and close to the center of trunk. .Push instead of pull whenever possible.   Ask For Help   Ask for help and delegate to others when possible. Coordinate your movements when lifting together, and maintain the low back curve.  Log Roll   Lying on back, bend left knee and place left   arm across chest. Roll all in one movement to the right. Reverse to roll to the left. Always move as one unit. Housework - Sweeping  Use long-handled equipment to avoid stooping.   Housework - Wiping  Position yourself as close as possible to reach work surface. Avoid straining your back.  Laundry - Unloading Wash   To unload small items at bottom of washer, lift leg opposite to arm being used to reach.  Grant close to area to be raked. Use arm movements to do the work. Keep back straight and avoid  twisting.     Cart  When reaching into cart with one arm, lift opposite leg to keep back straight.   Getting Into / Out of Bed  Lower self to lie down on one side by raising legs and lowering head at the same time. Use arms to assist moving without twisting. Bend both knees to roll onto back if desired. To sit up, start from lying on side, and use same move-ments in reverse. Housework - Vacuuming  Hold the vacuum with arm held at side. Step back and forth to move it, keeping head up. Avoid twisting.   Laundry - IT consultant so that bending and twisting can be avoided.   Laundry - Unloading Dryer  Squat down to reach into clothes dryer or use a reacher.  Gardening - Weeding / Probation officer or Kneel. Knee pads may be helpful.                  Sidelying Lumbar Stretch   Lie on side, close to edge of bed. Slowly lower ankles off bed until a stretch is felt in back. Hold _30___ seconds. Repeat __1__ times. Do _1-5___ sessions per day as needed for pain reduction.........Marland Kitchen  May modify lying over 2 pillows on Lt side reach arm overhead to lift ribs an add extra stretch.  As in quadratus lumborum stretch.  http://gt2.exer.us/256   Copyright  VHI. All rights reserved.

## 2015-01-13 ENCOUNTER — Ambulatory Visit: Payer: Medicaid Other | Admitting: Physical Therapy

## 2015-01-15 ENCOUNTER — Ambulatory Visit: Payer: Medicaid Other | Admitting: Physical Therapy

## 2015-01-15 DIAGNOSIS — R269 Unspecified abnormalities of gait and mobility: Secondary | ICD-10-CM

## 2015-01-15 DIAGNOSIS — M533 Sacrococcygeal disorders, not elsewhere classified: Secondary | ICD-10-CM

## 2015-01-15 DIAGNOSIS — G5701 Lesion of sciatic nerve, right lower limb: Secondary | ICD-10-CM

## 2015-01-15 DIAGNOSIS — M549 Dorsalgia, unspecified: Secondary | ICD-10-CM | POA: Diagnosis not present

## 2015-01-15 DIAGNOSIS — G8929 Other chronic pain: Secondary | ICD-10-CM

## 2015-01-15 NOTE — Therapy (Signed)
[place], [place], [address] Phone: [phone]   Fax:  [phone]  Physical Therapy Treatment  Patient Details  Name: Carla [name] MRN: [id number] Date of Birth: 1990/10/25 Referring Provider:  Earlie Server, MD  Encounter Date: 01/15/2015      PT End of Session - 01/15/15 1300    Visit Number 3   Number of Visits 12   Date for PT Re-Evaluation 02/03/15   PT Start Time 0937   PT Stop Time 1020   PT Time Calculation (min) 43 min   Equipment Utilized During Treatment Gait belt  to simulate SI Belt.   Activity Tolerance Patient tolerated treatment well   Behavior During Therapy [facility] for tasks assessed/performed      Past Medical History  Diagnosis Date  . Pregnancy induced hypertension   . Anxiety   . Neuromuscular disorder     complaints of leg pain since 6th grade  . HELLP syndrome in third trimester 12/31/2011  . S/P emergency cesarean section 01/01/2012  . Pregnancy headache   . S/P cesarean section 04/01/2014  . Ovarian neoplasm with low malignant potential 04/01/2014    Past Surgical History  Procedure Laterality Date  . Cesarean section  01/01/2012    Procedure: CESAREAN SECTION;  Surgeon: Thornell Sartorius, MD;  Location: [facility];  Service: Gynecology;  Laterality: N/A;  Primary cesarean section of baby  boy at 49  . Ovarian cystectomy    . Cesarean section Right 04/01/2014    Procedure: CESAREAN SECTION, with Right Salpingo Oophorectomy;  Surgeon: Janyth Contes, MD;  Location: [facility];  Service: Obstetrics;  Laterality: Right;    There were no vitals filed for this visit.  Visit Diagnosis:  Chronic back pain  SI (sacroiliac) joint dysfunction  Piriformis syndrome, right  Abnormality of gait      Subjective Assessment - 01/15/15 0939    Subjective 3/10.  Has not moved around alot today.  Quadratus techniques help.  Pain at work 8-9/10.  Stretches help a lot.    Currently in Pain? Yes   Pain Score 3    Pain Location Back   Pain Orientation Right;Left   Pain Descriptors / Indicators Numbness;Sharp   Pain Radiating Towards both hips   Pain Frequency Intermittent   Aggravating Factors  sitting too long, walking , work activities,    Pain Relieving Factors exercises, change of position   Multiple Pain Sites No                         [facility] Adult PT Treatment/Exercise - 01/15/15 0942    Self-Care   Other Self-Care Comments  gait with belt low on hips pulling RT ASIS posterior.  Patient immediately could bear weight through her RT LEG.  Other self care:  Anatomy pelvis/ importance of SI and how these areas work, piriformis location and why tightness here can cause pain.   Lumbar Exercises: Stretches   Lower Trunk Rotation Limitations painful today   Piriformis Stretch 3 reps;30 seconds  supine, in sidelying 10 second stretch   Lumbar Exercises: Supine   Bridge Limitations painful    Other Supine Lumbar Exercises muscle energy.  Rt ASIS higher, unable to lower.     Manual Therapy   Manual Therapy --  manual to RT illiac crest caused pains Lt knee PT notified,    Manual therapy comments Sidelying to stretch quadratus, very light pressure used, multiple tight and painful  areas Gluteal hip, quadratus lumborum.  followed by manual passive stretch Piriformis.                  PT Education - 01/15/15 1255    Education provided Yes   Education Details SI BELT, anatomy pelvis,sacrum piriformis   Person(s) Educated Patient   Methods Explanation;Demonstration;Verbal cues   Comprehension Verbalized understanding          PT Short Term Goals - 01/15/15 1304    PT SHORT TERM GOAL #1   Title I with initial HEP   Time 2   Period Weeks   Status On-going   PT SHORT TERM GOAL #2   Title Pt. will verbalize and demonstrated self care pain management and positioning including RICE   Time 3   Period Weeks   Status On-going            PT Long Term Goals - 01/15/15 1304    PT LONG TERM GOAL #1   Title Pt. will be I with advanced HEP   Period Weeks   Status On-going   PT LONG TERM GOAL #2   Title Pt. will report s decrease in pain from 7/10 to 3/10 in order to sleep through the night   Baseline 4   Time 6   Period Weeks   Status On-going   PT LONG TERM GOAL #3   Title Pt will demo normalized gait pattern indicating decreased pain in back   Baseline limps intermittantly   Time 6   Period Weeks   Status On-going   PT LONG TERM GOAL #4   Title Pt will increase lumbar ROM from 10 degrees to 20 degrees to be [facility]   Time 6   Period Weeks   Status Unable to assess               Plan - 01/15/15 1301    Clinical Impression Statement Unable to level hips today with muscle energy.  Pain unchaged at end of session.     PT Next Visit Plan assess treatment., pelvic girdle strengthening, stabilization exercises, ice, estim, manual to piriformis, McConnell Tape SI   PT Home Exercise Plan continue,    Consulted and Agree with Plan of Care Patient        Problem List Patient Active Problem List   Diagnosis Date Noted  . S/P cesarean section 04/01/2014  . 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 NST (non-stress test) nonreactive   . [redacted] weeks gestation of pregnancy   . Migraine, unspecified, without mention of intractable migraine without mention of status migrainosus 01/25/2014  . Pregnancy headache   . IUP (intrauterine pregnancy), incidental 08/23/2013  . Ovarian cancer on right 01/20/2012  . S/P emergency cesarean section 01/01/2012  . HELLP syndrome in third trimester 12/31/2011    [facility] 01/15/2015, 1:07 PM  [facility] [address] [place], [place], [address] Phone: [phone]   Fax:  [phone]     Melvenia Needles, PTA 01/15/2015 1:07 PM Phone: [phone] Fax: [phone]

## 2015-01-21 ENCOUNTER — Ambulatory Visit: Payer: Medicaid Other | Admitting: Physical Therapy

## 2015-01-21 DIAGNOSIS — M533 Sacrococcygeal disorders, not elsewhere classified: Secondary | ICD-10-CM

## 2015-01-21 DIAGNOSIS — G8929 Other chronic pain: Secondary | ICD-10-CM

## 2015-01-21 DIAGNOSIS — R269 Unspecified abnormalities of gait and mobility: Secondary | ICD-10-CM

## 2015-01-21 DIAGNOSIS — G5701 Lesion of sciatic nerve, right lower limb: Secondary | ICD-10-CM

## 2015-01-21 DIAGNOSIS — M549 Dorsalgia, unspecified: Secondary | ICD-10-CM | POA: Diagnosis not present

## 2015-01-21 NOTE — Patient Instructions (Signed)
From exercise drawer Lumbar stabilization 1. Also ball squeeze and isometric hip abduction into belt.

## 2015-01-21 NOTE — Therapy (Signed)
[place], [place], [address] Phone: [phone]   Fax:  [phone]  Physical Therapy Treatment  Patient Details  Name: Carla [name] MRN: [id number] Date of Birth: 09-12-1990 Referring Provider:  Earlie Server, MD  Encounter Date: 01/21/2015      PT End of Session - 01/21/15 1017    Visit Number 4   Number of Visits 12   Date for PT Re-Evaluation 02/03/15   PT Start Time 0937   PT Stop Time 1015   PT Time Calculation (min) 38 min   Activity Tolerance Patient tolerated treatment well;No increased pain   Behavior During Therapy [facility] for tasks assessed/performed      Past Medical History  Diagnosis Date  . Pregnancy induced hypertension   . Anxiety   . Neuromuscular disorder     complaints of leg pain since 6th grade  . HELLP syndrome in third trimester 12/31/2011  . S/P emergency cesarean section 01/01/2012  . Pregnancy headache   . S/P cesarean section 04/01/2014  . Ovarian neoplasm with low malignant potential 04/01/2014    Past Surgical History  Procedure Laterality Date  . Cesarean section  01/01/2012    Procedure: CESAREAN SECTION;  Surgeon: Thornell Sartorius, MD;  Location: [facility];  Service: Gynecology;  Laterality: N/A;  Primary cesarean section of baby  boy at 89  . Ovarian cystectomy    . Cesarean section Right 04/01/2014    Procedure: CESAREAN SECTION, with Right Salpingo Oophorectomy;  Surgeon: Janyth Contes, MD;  Location: [facility];  Service: Obstetrics;  Laterality: Right;    There were no vitals filed for this visit.  Visit Diagnosis:  Chronic back pain  SI (sacroiliac) joint dysfunction  Abnormality of gait  Piriformis syndrome, right      Subjective Assessment - 01/21/15 0940    Subjective Mild numbness now, much improved pain at work 8/10  now 3/10.  Stretches, exercises and use of belthelps.  Gets irritated at night.                          [facility] Adult PT Treatment/Exercise - 01/21/15 0954    Self-Care   Self-Care --  understands use of ice/heat   Lumbar Exercises: Stretches   Lower Trunk Rotation Limitations non painful today   Lumbar Exercises: Supine   Ab Set 10 reps;5 seconds   Clam 10 reps   Heel Slides 10 reps   Bent Knee Raise 10 reps  cues, added to home   Bent Knee Raise Limitations pain ful initially then better with ball squeeze.   Bridge Limitations 10  minor cue to lower one bone at a time.  Avoid hips wobble   Other Supine Lumbar Exercises ball squeeze 10 reps 5 seconds    Other Supine Lumbar Exercises isometric hip abduction with belt.                PT Education - 01/21/15 1013    Education provided Yes   Education Details ball squeeze, hip isometrics.  Lumbar stabilization 1   Person(s) Educated Patient   Methods Explanation;Tactile cues;Verbal cues;Handout;Demonstration   Comprehension Verbalized understanding          PT Short Term Goals - 01/21/15 1020    PT SHORT TERM GOAL #1   Title I with initial HEP   Time 2   Period Weeks   Status Achieved   PT SHORT TERM GOAL #2   Title  Pt. will verbalize and demonstrated self care pain management and positioning including RICE   Baseline understands, demonstrates   Time 3   Period Weeks   Status Achieved           PT Long Term Goals - 01/21/15 1021    PT LONG TERM GOAL #1   Title Pt. will be I with advanced HEP   Time 6   Period Weeks   Status On-going   PT LONG TERM GOAL #2   Title Pt. will report s decrease in pain from 7/10 to 3/10 in order to sleep through the night   Baseline pain does not wake,   Time 6   Period Weeks   Status Achieved   PT LONG TERM GOAL #3   Title Pt will demo normalized gait pattern indicating decreased pain in back   Baseline limps intermittantly   Time 6   Period Weeks   Status On-going   PT LONG TERM GOAL #4   Title Pt will increase lumbar ROM from 10 degrees to 20 degrees to be [facility]    Baseline WNL and painfree   Time 6   Period Weeks   Status Achieved               Plan - 01/21/15 1017    Clinical Impression Statement Pain goals and sleeping goals and RICE goals met.  Continues to need core strengthening.    PT Next Visit Plan stabilization1 and SI exercise review, core   PT Home Exercise Plan Lumbar stab 1, ball squeeze etc,.   Consulted and Agree with Plan of Care Patient        Problem List Patient Active Problem List   Diagnosis Date Noted  . S/P cesarean section 04/01/2014  . 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 NST (non-stress test) nonreactive   . [redacted] weeks gestation of pregnancy   . Migraine, unspecified, without mention of intractable migraine without mention of status migrainosus 01/25/2014  . Pregnancy headache   . IUP (intrauterine pregnancy), incidental 08/23/2013  . Ovarian cancer on right 01/20/2012  . S/P emergency cesarean section 01/01/2012  . HELLP syndrome in third trimester 12/31/2011    [facility] 01/21/2015, 10:23 AM  [facility] [address] [place], [place], [address] Phone: [phone]   Fax:  [phone]     Melvenia Needles, PTA 01/21/2015 10:23 AM Phone: [phone] Fax: [phone]

## 2015-01-27 ENCOUNTER — Ambulatory Visit: Payer: Medicaid Other | Admitting: Physical Therapy

## 2015-01-27 DIAGNOSIS — R269 Unspecified abnormalities of gait and mobility: Secondary | ICD-10-CM

## 2015-01-27 DIAGNOSIS — M533 Sacrococcygeal disorders, not elsewhere classified: Secondary | ICD-10-CM

## 2015-01-27 DIAGNOSIS — M549 Dorsalgia, unspecified: Secondary | ICD-10-CM

## 2015-01-27 DIAGNOSIS — G8929 Other chronic pain: Secondary | ICD-10-CM

## 2015-01-27 NOTE — Therapy (Addendum)
[place], [place], [address] Phone: [phone]   Fax:  [phone]  Physical Therapy Treatment  Patient Details  Name: Carla [name] MRN: [id number] Date of Birth: 17-Jul-1990 Referring Provider:  Earlie Server, MD  Encounter Date: 01/27/2015      PT End of Session - 01/27/15 1414    Visit Number 5   Number of Visits 12   Date for PT Re-Evaluation 02/03/15   PT Start Time 8527   PT Stop Time 1415   PT Time Calculation (min) 42 min   Activity Tolerance Patient tolerated treatment well;Patient limited by pain   Behavior During Therapy [facility] for tasks assessed/performed      Past Medical History  Diagnosis Date  . Pregnancy induced hypertension   . Anxiety   . Neuromuscular disorder     complaints of leg pain since 6th grade  . HELLP syndrome in third trimester 12/31/2011  . S/P emergency cesarean section 01/01/2012  . Pregnancy headache   . S/P cesarean section 04/01/2014  . Ovarian neoplasm with low malignant potential 04/01/2014    Past Surgical History  Procedure Laterality Date  . Cesarean section  01/01/2012    Procedure: CESAREAN SECTION;  Surgeon: Thornell Sartorius, MD;  Location: [facility];  Service: Gynecology;  Laterality: N/A;  Primary cesarean section of baby  boy at 72  . Ovarian cystectomy    . Cesarean section Right 04/01/2014    Procedure: CESAREAN SECTION, with Right Salpingo Oophorectomy;  Surgeon: Janyth Contes, MD;  Location: [facility];  Service: Obstetrics;  Laterality: Right;    There were no vitals filed for this visit.  Visit Diagnosis:  SI (sacroiliac) joint dysfunction  Chronic back pain  Abnormality of gait      Subjective Assessment - 01/27/15 1335    Subjective No pain.  Last pain Sat evening at work toward the end of the shift.     Currently in Pain? No/denies   Multiple Pain Sites No                         [facility] Adult PT  Treatment/Exercise - 01/27/15 1336    Lumbar Exercises: Stretches   Pelvic Tilt 10 seconds   Lumbar Exercises: Aerobic   Stationary Bike 5 minutes  .9 miles   Lumbar Exercises: Supine   Clam 10 reps;5 seconds  isometric    Bridge 10 reps  cues,  also bridge with clams, hips dip some.  10 x  2 vsets   Knee/Hip Exercises: Supine   Other Supine Knee/Hip Exercises muscle energy 5 reps 5 seconds to help lower RT ASIS.  With SI Belt                   PT Short Term Goals - 01/27/15 1416    PT SHORT TERM GOAL #1   Title I with initial HEP   Status Achieved   PT SHORT TERM GOAL #2   Title Pt. will verbalize and demonstrated self care pain management and positioning including RICE   Status Achieved           PT Long Term Goals - 01/27/15 1417    PT LONG TERM GOAL #1   Title Pt. will be I with advanced HEP   Status On-going   PT LONG TERM GOAL #2   Title Pt. will report s decrease in pain from 7/10 to 3/10 in order to sleep through the  night   Time 6   Period Weeks   Status Achieved   PT LONG TERM GOAL #3   Title Pt will demo normalized gait pattern indicating decreased pain in back   Baseline limps intermittantly   Time 6   Period Weeks   Status On-going   PT LONG TERM GOAL #4   Status Achieved               Plan - 01/27/15 1415    Clinical Impression Statement No pain initially the pain flare with clams on LT.  Able to have no pain at end of session with muscle energy, exercise and belt.     PT Next Visit Plan stabilization1 and SI exercise review, core,  cautious with clams they increased SI ain today.     PT Home Exercise Plan Lumbar stab 1, ball squeeze etc,.   Consulted and Agree with Plan of Care Patient        Problem List Patient Active Problem List   Diagnosis Date Noted  . S/P cesarean section 04/01/2014  . 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 NST (non-stress test) nonreactive   . [redacted] weeks gestation of pregnancy    . Migraine, unspecified, without mention of intractable migraine without mention of status migrainosus 01/25/2014  . Pregnancy headache   . IUP (intrauterine pregnancy), incidental 08/23/2013  . Ovarian cancer on right 01/20/2012  . S/P emergency cesarean section 01/01/2012  . HELLP syndrome in third trimester 12/31/2011    [facility] 01/27/2015, 2:18 PM  [facility] [address] [place], [place], [address] Phone: [phone]   Fax:  [phone]     Melvenia Needles, PTA 01/27/2015 2:18 PM Phone: [phone] Fax: [phone]   PHYSICAL THERAPY DISCHARGE SUMMARY  Visits from Start of Care: 5  Current functional level related to goals / functional outcomes: See above, unknown   Remaining deficits: See above unknown as of today   Education / Equipment: SI Belt, stab ex, HEP, posture and lifting Plan: Patient agrees to discharge.  Patient goals were partially met. Patient is being discharged due to not returning since the last visit.  ?????   Raeford Razor, PT 03/20/2015 2:44 PM Phone: [phone] Fax: [phone]

## 2015-01-29 ENCOUNTER — Ambulatory Visit: Payer: Medicaid Other | Admitting: Physical Therapy

## 2015-02-17 ENCOUNTER — Ambulatory Visit: Payer: No Typology Code available for payment source | Attending: Orthopedic Surgery | Admitting: Physical Therapy

## 2016-03-23 ENCOUNTER — Encounter (HOSPITAL_COMMUNITY): Payer: Self-pay | Admitting: Emergency Medicine

## 2016-03-23 ENCOUNTER — Emergency Department (HOSPITAL_COMMUNITY): Payer: Medicaid Other

## 2016-03-23 ENCOUNTER — Emergency Department (HOSPITAL_COMMUNITY)
Admission: EM | Admit: 2016-03-23 | Discharge: 2016-03-23 | Disposition: A | Discharge status: Home / Self Care | Payer: Medicaid Other | Attending: Emergency Medicine | Admitting: Emergency Medicine

## 2016-03-23 DIAGNOSIS — X58XXXA Exposure to other specified factors, initial encounter: Secondary | ICD-10-CM | POA: Insufficient documentation

## 2016-03-23 DIAGNOSIS — Y929 Unspecified place or not applicable: Secondary | ICD-10-CM | POA: Insufficient documentation

## 2016-03-23 DIAGNOSIS — Z91040 Latex allergy status: Secondary | ICD-10-CM | POA: Insufficient documentation

## 2016-03-23 DIAGNOSIS — Y9344 Activity, trampolining: Secondary | ICD-10-CM | POA: Insufficient documentation

## 2016-03-23 DIAGNOSIS — Z8543 Personal history of malignant neoplasm of ovary: Secondary | ICD-10-CM | POA: Insufficient documentation

## 2016-03-23 DIAGNOSIS — Y999 Unspecified external cause status: Secondary | ICD-10-CM | POA: Insufficient documentation

## 2016-03-23 DIAGNOSIS — M25562 Pain in left knee: Secondary | ICD-10-CM

## 2016-03-23 MED ORDER — NAPROXEN 500 MG PO TABS: 500.0000 mg | ORAL_TABLET | Freq: Two times a day (BID) | ORAL | 0 refills | Status: AC

## 2016-03-23 NOTE — ED Provider Notes (Signed)
[facility] DEPT Provider Note  CSN: [id number] Arrival date & time: 03/23/16  1825  History   Chief Complaint Chief Complaint  Patient presents with  . Knee Pain   HPI Carla [name] is a 25 y.o. female with pertinent pmh of previous L knee bucket meniscal tear (2014) with surgical repair on 2015, presents to ED with a "stiff"  L knee, L knee pain and decreased L knee ROM.  Pt states 2 weeks ago she was jumping on trampoline when she heard something in her L knee pop.  Since then she has been having a "buckling" or "locking" sensation of her L knee a/w with pain with L knee extension and weight bearing.  Pt states today she stood up and was unable to extend her knee completely.  Her knee is now "stuck" in that position.  Pt denies lower extremity weakness, numbness.  No recent trauma, falls or injury to L knee.   HPI  Past Medical History:  Diagnosis Date  . Anxiety   . HELLP syndrome in third trimester 12/31/2011  . Neuromuscular disorder (HCC)    complaints of leg pain since 6th grade  . 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 Pregnancy headache   . Pregnancy induced hypertension   . S/P cesarean section 04/01/2014  . S/P emergency cesarean section 01/01/2012    Patient Active Problem List   Diagnosis Date Noted  . S/P cesarean section 04/01/2014  . 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 NST (non-stress test) nonreactive   . [redacted] weeks gestation of pregnancy   . Migraine, unspecified, without mention of intractable migraine without mention of status migrainosus 01/25/2014  . Pregnancy headache   . IUP (intrauterine pregnancy), incidental 08/23/2013  . Ovarian cancer on right ([place]) 01/20/2012  . S/P emergency cesarean section 01/01/2012  . HELLP syndrome in third trimester 12/31/2011    Past Surgical History:  Procedure Laterality Date  . CESAREAN SECTION  01/01/2012   Procedure: CESAREAN SECTION;  Surgeon: Thornell Sartorius, MD;   Location: [facility];  Service: Gynecology;  Laterality: N/A;  Primary cesarean section of baby  boy at 44  . CESAREAN SECTION Right 04/01/2014   Procedure: CESAREAN SECTION, with Right Salpingo Oophorectomy;  Surgeon: Janyth Contes, MD;  Location: [facility];  Service: Obstetrics;  Laterality: Right;  . ovarian cystectomy      OB History    Gravida Para Term Preterm AB Living   3 3 2 1   3    SAB TAB Ectopic Multiple Live Births         0 3      Home Medications    Prior to Admission medications   Medication Sig Start Date End Date Taking? Authorizing Provider  acetaminophen (TYLENOL) 500 MG tablet Take 500 mg by mouth every 6 (six) hours as needed. pain    Historical Provider, MD  ibuprofen (ADVIL,MOTRIN) 600 MG tablet Take 1 tablet (600 mg total) by mouth every 6 (six) hours as needed. 10/11/14   Elnora Morrison, MD  magnesium oxide (MAG-OX) 400 MG tablet Take 1 tablet (400 mg total) by mouth 2 (two) times daily. Patient not taking: Reported on 05/27/2014 01/25/14   Marcial Pacas, MD  medroxyPROGESTERone (DEPO-PROVERA) 150 MG/ML injection Inject 150 mg into the muscle every 3 (three) months.    Historical Provider, MD  naproxen (NAPROSYN) 500 MG tablet Take 1 tablet (500 mg total) by mouth 2 (two) times daily with a meal. 03/23/16 04/07/16  Rosemarie Ax  Neysa Hotter, PA-C  Prenatal Vit-Fe Fumarate-FA (PRENATAL MULTIVITAMIN) TABS tablet Take 1 tablet by mouth daily. 04/04/14   Janyth Contes, MD  Riboflavin 100 MG CAPS Take 1 capsule (100 mg total) by mouth 2 (two) times daily. Patient not taking: Reported on 05/27/2014 01/25/14   Marcial Pacas, MD    Family History Family History  Problem Relation Age of Onset  . ADD / ADHD Brother   . ADD / ADHD Mother   . Diabetes Maternal Aunt   . Diabetes Maternal Uncle   . High blood pressure    . High Cholesterol      Social History Social History  Substance Use Topics  . Smoking status: Never Smoker  . Smokeless tobacco: Never Used  . Alcohol use  No     Allergies   Latex   Review of Systems Review of Systems  Constitutional: Negative for fatigue.  HENT: Negative for congestion and sore throat.   Eyes: Negative for visual disturbance.  Respiratory: Negative for shortness of breath.   Cardiovascular: Negative for chest pain.  Gastrointestinal: Negative for abdominal pain.  Genitourinary: Negative for dysuria and pelvic pain.  Musculoskeletal: Positive for arthralgias (L knee). Negative for back pain.  Skin: Negative for rash.  Neurological: Negative for numbness.  Psychiatric/Behavioral: Negative.      Physical Exam Updated Vital Signs BP 133/68   Pulse 66   Temp 98.3 F (36.8 C) (Oral)   Resp 17   Ht 5\' 6"  (1.676 m)   Wt 77.1 kg   LMP 03/22/2016   SpO2 100%   BMI 27.44 kg/m   Physical Exam  Constitutional: She is oriented to person, place, and time. Vital signs are normal. She appears well-developed and well-nourished. No distress.  HENT:  Head: Normocephalic and atraumatic.  Eyes: Conjunctivae and EOM are normal. Pupils are equal, round, and reactive to light.  Neck: Normal range of motion.  Cardiovascular: Normal rate, regular rhythm, normal heart sounds and intact distal pulses.   No murmur heard. Pulmonary/Chest: Effort normal and breath sounds normal. She has no wheezes.  Abdominal: Soft. Bowel sounds are normal. There is no tenderness.  Musculoskeletal:       Left knee: She exhibits decreased range of motion (decreased knee extension due to pain) and effusion (mild, inferior to patella). She exhibits no ecchymosis, no deformity, no laceration, no erythema, normal alignment, no LCL laxity, normal patellar mobility, no bony tenderness, normal meniscus and no MCL laxity. Tenderness (at medial and lateral joint line) found. Medial joint line, lateral joint line and patellar tendon tenderness noted. No MCL and no LCL tenderness noted.  Antalgic gait favoring L side  Lymphadenopathy:    She has no cervical  adenopathy.  Neurological: She is alert and oriented to person, place, and time.  Skin: Skin is warm and dry.  Psychiatric: She has a normal mood and affect. Her behavior is normal.  Nursing note and vitals reviewed.  ED Treatments / Results  Labs (all labs ordered are listed, but only abnormal results are displayed) Labs Reviewed - No data to display  EKG  EKG Interpretation None       Radiology Dg Knee Complete 4 Views Left  Result Date: 03/23/2016 CLINICAL DATA:  Posterior left knee pain EXAM: LEFT KNEE - COMPLETE 4+ VIEW COMPARISON:  10/15/2012 FINDINGS: No acute displaced fracture or malalignment. Subtle subarticular sclerosis and lucency within the lateral femoral condyle of the distal femur is noted, this is in the region of previous osteochondral lesion.  No significant joint effusion. IMPRESSION: 1. No acute displaced fracture or malalignment 2. Subtle subarticular sclerosis and lucency within the lateral femoral condyles, compatible with history of osteochondral lesion. Electronically Signed   By: Donavan Foil M.D.   On: 03/23/2016 19:33    Procedures Procedures (including critical care time)  Medications Ordered in ED Medications - No data to display   Initial Impression / Assessment and Plan / ED Course  I have reviewed the triage vital signs and the nursing notes.  Pertinent labs & imaging results that were available during my care of the patient were reviewed by me and considered in my medical decision making (see chart for details).  Clinical Course    25 yo pmh with pertinent pmh of previous bucket tear of meniscus of L knee presents with decreased L knee ROM and pain. On exam pt has mild effusion inferior to patella over patellar tendon, tenderness over medial and later joint lines and over patellar tendon.  Decreased L knee extension with antalgic gait favoring L side.  Neuro intact.  DP and popliteal pulses strong bilaterally.  X rays negative for acute fx or  dislocation.  Given symptoms, negative x-rays and previous meniscal injury, pt has likely suffered a repeat meniscal injury or ligamentous injury not visualized on x-ray.  Pt discharged with naprosyn, knee immobilizer, crutches and close Ortho follow up.  ED return instructions given.    Final Clinical Impressions(s) / ED Diagnoses   Final diagnoses:  Acute pain of left knee    New Prescriptions Discharge Medication List as of 03/23/2016  7:55 PM    START taking these medications   Details  naproxen (NAPROSYN) 500 MG tablet Take 1 tablet (500 mg total) by mouth 2 (two) times daily with a meal., Starting Tue 03/23/2016, Until Wed 04/07/2016, Print         Kinnie Feil, PA-C 03/23/16 2152    Noemi Chapel, MD 03/24/16 2125

## 2016-03-23 NOTE — ED Notes (Signed)
Ortho tech at bedside 

## 2016-03-23 NOTE — Progress Notes (Signed)
Orthopedic Tech Progress Note Patient Details:  Carla [name] 1990/10/28 [id number]  Ortho Devices Type of Ortho Device: Knee Immobilizer, Crutches Ortho Device/Splint Location: LLE Ortho Device/Splint Interventions: Application, Ordered   Braulio Bosch 03/23/2016, 8:13 PM

## 2016-03-23 NOTE — ED Triage Notes (Signed)
Pt sts left knee pain and felt pop while working out

## 2016-03-23 NOTE — Discharge Instructions (Signed)
Please take naprosyn as prescribed, which will help with pain and inflammation Use knee stabilizer and crutches to ambulate Ice will decrease swelling Follow up with orthopedist for further imaging and treatment

## 2016-03-23 NOTE — ED Notes (Signed)
Pt. returned from XR. 

## 2017-07-01 ENCOUNTER — Encounter (HOSPITAL_COMMUNITY): Payer: Self-pay | Admitting: Family Medicine

## 2017-07-01 ENCOUNTER — Ambulatory Visit (HOSPITAL_COMMUNITY)
Admission: EM | Admit: 2017-07-01 | Discharge: 2017-07-01 | Disposition: A | Discharge status: Home / Self Care | Payer: 59 | Attending: Internal Medicine | Admitting: Internal Medicine

## 2017-07-01 DIAGNOSIS — R112 Nausea with vomiting, unspecified: Secondary | ICD-10-CM | POA: Diagnosis not present

## 2017-07-01 DIAGNOSIS — R1013 Epigastric pain: Secondary | ICD-10-CM | POA: Diagnosis not present

## 2017-07-01 DIAGNOSIS — R1012 Left upper quadrant pain: Secondary | ICD-10-CM | POA: Diagnosis not present

## 2017-07-01 LAB — POCT I-STAT, CHEM 8
BUN: 18 mg/dL (ref 6–20)
CALCIUM ION: 1.19 mmol/L (ref 1.15–1.40)
CHLORIDE: 104 mmol/L (ref 101–111)
CREATININE: 0.8 mg/dL (ref 0.44–1.00)
Glucose, Bld: 86 mg/dL (ref 65–99)
HCT: 43 % (ref 36.0–46.0)
Hemoglobin: 14.6 g/dL (ref 12.0–15.0)
Potassium: 4.2 mmol/L (ref 3.5–5.1)
Sodium: 141 mmol/L (ref 135–145)
TCO2: 25 mmol/L (ref 22–32)

## 2017-07-01 LAB — POCT H PYLORI SCREEN: H. PYLORI SCREEN, POC: NEGATIVE

## 2017-07-01 MED ORDER — ONDANSETRON HCL 8 MG PO TABS: 8.0000 mg | ORAL_TABLET | Freq: Three times a day (TID) | ORAL | 0 refills | Status: DC | PRN

## 2017-07-01 MED ORDER — DICYCLOMINE HCL 20 MG PO TABS: 20.0000 mg | ORAL_TABLET | Freq: Two times a day (BID) | ORAL | 0 refills | Status: DC | PRN

## 2017-07-01 NOTE — Discharge Instructions (Addendum)
Recommend take Zofran 8mg  every 8 hours as needed for nausea and vomiting. May take Bentyl 10mg  twice a day before meals as needed for abdominal pain/cramping. Recommend eat small, frequent, bland foods for next few days. May also take OTC Zantac 150mg  in the morning. Recommend follow-up with your PCP or GYN for further evaluation if not improving within 4 to 5 days or go to ER if symptoms worsen.

## 2017-07-01 NOTE — ED Provider Notes (Signed)
[facility]    CSN: [id number] Arrival date & time: 07/01/17  1007     History   Chief Complaint Chief Complaint  Patient presents with  . Nausea  . Abdominal Pain    HPI Carla [name] is a 27 y.o. female.   27 year old female presents with intermittent dizziness and nausea for about 10 days. Now started having left upper abdominal pain for the past 5 days. Still experiencing nausea and has vomited today. Pain is worse when she tries to eat and in the morning. Denies any diarrhea or constipation, although she has taken a laxative last week to assist with constipation. Denies any fever, dysuria, flank pain or unusual vaginal discharge. She has not taken any medication for symptoms. Also experiencing cold intolerance and has a history of anemia many years ago. Has history of pregnancy-related complications but no other chronic health issues. Currently on DepoProvera but no oral medications daily.    The history is provided by the patient.    Past Medical History:  Diagnosis Date  . Anxiety   . HELLP syndrome in third trimester 12/31/2011  . Neuromuscular disorder (HCC)    complaints of leg pain since 6th grade  . 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 Pregnancy headache   . Pregnancy induced hypertension   . S/P cesarean section 04/01/2014  . S/P emergency cesarean section 01/01/2012    Patient Active Problem List   Diagnosis Date Noted  . S/P cesarean section 04/01/2014  . 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 NST (non-stress test) nonreactive   . [redacted] weeks gestation of pregnancy   . Migraine, unspecified, without mention of intractable migraine without mention of status migrainosus 01/25/2014  . Pregnancy headache   . IUP (intrauterine pregnancy), incidental 08/23/2013  . Ovarian cancer on right ([place]) 01/20/2012  . S/P emergency cesarean section 01/01/2012  . HELLP syndrome in third trimester 12/31/2011     Past Surgical History:  Procedure Laterality Date  . CESAREAN SECTION  01/01/2012   Procedure: CESAREAN SECTION;  Surgeon: Thornell Sartorius, MD;  Location: [facility];  Service: Gynecology;  Laterality: N/A;  Primary cesarean section of baby  boy at 63  . CESAREAN SECTION Right 04/01/2014   Procedure: CESAREAN SECTION, with Right Salpingo Oophorectomy;  Surgeon: Janyth Contes, MD;  Location: [facility];  Service: Obstetrics;  Laterality: Right;  . ovarian cystectomy      OB History    Gravida Para Term Preterm AB Living   3 3 2 1   3    SAB TAB Ectopic Multiple Live Births         0 3       Home Medications    Prior to Admission medications   Medication Sig Start Date End Date Taking? Authorizing Provider  acetaminophen (TYLENOL) 500 MG tablet Take 500 mg by mouth every 6 (six) hours as needed. pain    [provider]  dicyclomine (BENTYL) 20 MG tablet Take 1 tablet (20 mg total) by mouth 2 (two) times daily as needed for spasms (and abdominal pain). 07/01/17   Katy Apo, NP  medroxyPROGESTERone (DEPO-PROVERA) 150 MG/ML injection Inject 150 mg into the muscle every 3 (three) months.    [provider]  ondansetron (ZOFRAN) 8 MG tablet Take 1 tablet (8 mg total) by mouth every 8 (eight) hours as needed for nausea or vomiting. 07/01/17   Katy Apo, NP    Family History Family History  Problem Relation Age of Onset  . ADD / ADHD Mother   . ADD / ADHD Brother   . Diabetes Maternal Aunt   . Diabetes Maternal Uncle   . High blood pressure Unknown   . High Cholesterol Unknown     Social History Social History   Tobacco Use  . Smoking status: Never Smoker  . Smokeless tobacco: Never Used  Substance Use Topics  . Alcohol use: No  . Drug use: No     Allergies   Latex   Review of Systems Review of Systems  Constitutional: Positive for appetite change and fatigue. Negative for activity change, chills, diaphoresis and fever.  HENT: Negative for  congestion, mouth sores, postnasal drip, sore throat and trouble swallowing.   Eyes: Negative for photophobia and visual disturbance.  Respiratory: Negative for cough, chest tightness, shortness of breath and wheezing.   Cardiovascular: Negative for chest pain and palpitations.  Gastrointestinal: Positive for abdominal pain, nausea and vomiting. Negative for blood in stool and diarrhea.  Endocrine: Positive for cold intolerance. Negative for polyuria.  Genitourinary: Negative for difficulty urinating, dysuria, flank pain, frequency, hematuria, menstrual problem, pelvic pain, urgency and vaginal discharge.  Musculoskeletal: Negative for arthralgias, back pain, myalgias and neck pain.  Skin: Negative for color change, rash and wound.  Allergic/Immunologic: Negative for immunocompromised state.  Neurological: Positive for dizziness, light-headedness and headaches. Negative for tremors, seizures, syncope, weakness and numbness.  Hematological: Negative for adenopathy. Does not bruise/bleed easily.     Physical Exam Triage Vital Signs ED Triage Vitals [07/01/17 1034]  Enc Vitals Group     BP 114/78     Pulse Rate 64     Resp 18     Temp 98.3 F (36.8 C)     Temp src      SpO2 100 %     Weight      Height      Head Circumference      Peak Flow      Pain Score 7     Pain Loc      Pain Edu?      Excl. in [place]?    No data found.  Updated Vital Signs BP 114/78   Pulse 64   Temp 98.3 F (36.8 C)   Resp 18   LMP 06/10/2017   SpO2 100%   Breastfeeding? No   Visual Acuity Right Eye Distance:   Left Eye Distance:   Bilateral Distance:    Right Eye Near:   Left Eye Near:    Bilateral Near:     Physical Exam  Constitutional: She is oriented to person, place, and time. She appears well-developed and well-nourished. She does not appear ill. No distress.  HENT:  Head: Normocephalic and atraumatic.  Right Ear: Hearing and external ear normal.  Left Ear: Hearing and external ear  normal.  Nose: Nose normal.  Mouth/Throat: Uvula is midline, oropharynx is clear and moist and mucous membranes are normal.  Eyes: Conjunctivae and EOM are normal.  Neck: Normal range of motion. Neck supple.  Cardiovascular: Normal rate, regular rhythm and normal heart sounds.  No murmur heard. Pulmonary/Chest: Effort normal and breath sounds normal. No stridor. No respiratory distress. She has no decreased breath sounds. She has no wheezes. She has no rhonchi. She has no rales.  Abdominal: Soft. Normal appearance and bowel sounds are normal. There is no hepatosplenomegaly. There is tenderness (worse on left upper quadrant) in the right upper quadrant, epigastric area and left upper  quadrant. There is no rigidity, no rebound, no guarding and no CVA tenderness.    Tenderness mostly left upper quadrant to epigastric area.   Musculoskeletal: Normal range of motion.  Lymphadenopathy:    She has no cervical adenopathy.  Neurological: She is alert and oriented to person, place, and time.  Skin: Skin is warm and dry. Capillary refill takes less than 2 seconds.  Psychiatric: She has a normal mood and affect. Her behavior is normal. Judgment and thought content normal.     UC Treatments / Results  Labs (all labs ordered are listed, but only abnormal results are displayed) Labs Reviewed  POCT I-STAT, CHEM 8  POCT H PYLORI SCREEN    EKG  EKG Interpretation None       Radiology No results found.  Procedures Procedures (including critical care time)  Medications Ordered in UC Medications - No data to display   Initial Impression / Assessment and Plan / UC Course  I have reviewed the triage vital signs and the nursing notes.  Pertinent labs & imaging results that were available during my care of the patient were reviewed by me and considered in my medical decision making (see chart for details).    Reviewed negative H. Pylor test and I-stat screen. No distinct anemia or glucose  abnormality. Reviewed that she may still have acid reflux or gastric ulcer. Recommend start Zofran 8mg  every 8 hours as needed for nausea and vomiting. May take Bentyl 10mg  twice a day before meals as needed for abdominal pain/cramping. Recommend eat small, frequent, bland foods for next few days. May also take OTC Zantac 150mg  in the morning. Discussed that she needs additional labwork and further evaluation if symptoms persist. May also consider Prilosec or similar medication if symptoms do not improve. Note written for work today. Recommend follow-up with her PCP or GYN for further evaluation if she is not improving within 4 to 5 days or go to ER if symptoms worsen.    Final Clinical Impressions(s) / UC Diagnoses   Final diagnoses:  Abdominal pain, left upper quadrant  Abdominal pain, epigastric  Non-intractable vomiting with nausea, unspecified vomiting type    ED Discharge Orders        Ordered    ondansetron (ZOFRAN) 8 MG tablet  Every 8 hours PRN     07/01/17 1208    dicyclomine (BENTYL) 20 MG tablet  2 times daily PRN     07/01/17 1208       Controlled Substance Prescriptions [place] Controlled Substance Registry consulted? Not Applicable   Katy Apo, NP 07/02/17 [phone]

## 2017-07-01 NOTE — ED Triage Notes (Signed)
Pt here for almost 2 weeks of upper abd pain, nausea, dizziness, and cold intolerance. The pain in her abdomen is worse after she eats and comes in waves throughout the day. LMP- 3 weeks ago. She has a hx of anemia when she was younger.

## 2017-07-30 ENCOUNTER — Other Ambulatory Visit: Payer: Self-pay

## 2017-07-30 ENCOUNTER — Encounter (HOSPITAL_COMMUNITY): Payer: Self-pay | Admitting: Emergency Medicine

## 2017-07-30 ENCOUNTER — Emergency Department (HOSPITAL_COMMUNITY)
Admission: EM | Admit: 2017-07-30 | Discharge: 2017-07-30 | Disposition: A | Discharge status: Home / Self Care | Payer: 59 | Attending: Emergency Medicine | Admitting: Emergency Medicine

## 2017-07-30 DIAGNOSIS — J029 Acute pharyngitis, unspecified: Secondary | ICD-10-CM | POA: Diagnosis not present

## 2017-07-30 DIAGNOSIS — Z91040 Latex allergy status: Secondary | ICD-10-CM | POA: Insufficient documentation

## 2017-07-30 DIAGNOSIS — Z79899 Other long term (current) drug therapy: Secondary | ICD-10-CM | POA: Diagnosis not present

## 2017-07-30 LAB — RAPID STREP SCREEN (MED CTR MEBANE ONLY): Streptococcus, Group A Screen (Direct): NEGATIVE

## 2017-07-30 MED ORDER — AMOXICILLIN-POT CLAVULANATE 875-125 MG PO TABS: 1.0000 | ORAL_TABLET | Freq: Two times a day (BID) | ORAL | 0 refills | Status: DC

## 2017-07-30 NOTE — ED Triage Notes (Signed)
Pt to ER for evaluation of flu like symptoms x 5 days with fever, body aches, and dry cough. VSS. Unrelieved by OTC medications

## 2017-07-30 NOTE — ED Provider Notes (Signed)
[facility] EMERGENCY DEPARTMENT Provider Note   CSN: [id number] Arrival date & time: 07/30/17  1041     History   Chief Complaint Chief Complaint  Patient presents with  . Sore Throat  . Generalized Body Aches    HPI Carla [name] is a 27 y.o. female.  27 year old female with no relevant prior medical history presents with complaint of sore throat and congestion.  Patient reports onset of symptoms about 6 days previously.  Patient complains of painful, swollen tonsils.  Patient denies sick contacts at home.  Patient is a mother and lives with 3 small children.  She denies chest pain. She denies shortness of breath, nausea, vomiting, abdominal pain, or other acute complaint.  The history is provided by the patient.  Sore Throat  This is a new problem. The current episode started more than 2 days ago. The problem occurs rarely. The problem has not changed since onset.Pertinent negatives include no chest pain and no abdominal pain. Nothing aggravates the symptoms. Nothing relieves the symptoms. She has tried nothing for the symptoms. The treatment provided no relief.    Past Medical History:  Diagnosis Date  . Anxiety   . HELLP syndrome in third trimester 12/31/2011  . Neuromuscular disorder (HCC)    complaints of leg pain since 6th grade  . 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 Pregnancy headache   . Pregnancy induced hypertension   . S/P cesarean section 04/01/2014  . S/P emergency cesarean section 01/01/2012    Patient Active Problem List   Diagnosis Date Noted  . S/P cesarean section 04/01/2014  . 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 NST (non-stress test) nonreactive   . [redacted] weeks gestation of pregnancy   . Migraine, unspecified, without mention of intractable migraine without mention of status migrainosus 01/25/2014  . Pregnancy headache   . IUP (intrauterine pregnancy), incidental 08/23/2013  .  Ovarian cancer on right ([place]) 01/20/2012  . S/P emergency cesarean section 01/01/2012  . HELLP syndrome in third trimester 12/31/2011    Past Surgical History:  Procedure Laterality Date  . CESAREAN SECTION  01/01/2012   Procedure: CESAREAN SECTION;  Surgeon: Thornell Sartorius, MD;  Location: [facility];  Service: Gynecology;  Laterality: N/A;  Primary cesarean section of baby  boy at 80  . CESAREAN SECTION Right 04/01/2014   Procedure: CESAREAN SECTION, with Right Salpingo Oophorectomy;  Surgeon: Janyth Contes, MD;  Location: [facility];  Service: Obstetrics;  Laterality: Right;  . ovarian cystectomy       OB History    Gravida  3   Para  3   Term  2   Preterm  1   AB      Living  3     SAB      TAB      Ectopic      Multiple  0   Live Births  3            Home Medications    Prior to Admission medications   Medication Sig Start Date End Date Taking? Authorizing Provider  acetaminophen (TYLENOL) 500 MG tablet Take 500 mg by mouth every 6 (six) hours as needed. pain    [provider]  amoxicillin-clavulanate (AUGMENTIN) 875-125 MG tablet Take 1 tablet by mouth every 12 (twelve) hours. 07/30/17   Valarie Merino, MD  dicyclomine (BENTYL) 20 MG tablet Take 1 tablet (20 mg total) by mouth 2 (two) times daily as  needed for spasms (and abdominal pain). 07/01/17   Katy Apo, NP  medroxyPROGESTERone (DEPO-PROVERA) 150 MG/ML injection Inject 150 mg into the muscle every 3 (three) months.    [provider]  ondansetron (ZOFRAN) 8 MG tablet Take 1 tablet (8 mg total) by mouth every 8 (eight) hours as needed for nausea or vomiting. 07/01/17   Katy Apo, NP    Family History Family History  Problem Relation Age of Onset  . ADD / ADHD Mother   . ADD / ADHD Brother   . Diabetes Maternal Aunt   . Diabetes Maternal Uncle   . High blood pressure Unknown   . High Cholesterol Unknown     Social History Social History   Tobacco Use  . Smoking  status: Never Smoker  . Smokeless tobacco: Never Used  Substance Use Topics  . Alcohol use: No  . Drug use: No     Allergies   Latex   Review of Systems Review of Systems  HENT: Positive for sore throat.   Cardiovascular: Negative for chest pain.  Gastrointestinal: Negative for abdominal pain.  All other systems reviewed and are negative.    Physical Exam Updated Vital Signs BP 128/70 (BP Location: Right Arm)   Pulse 73   Temp 98.2 F (36.8 C) (Oral)   Resp 18   LMP 07/20/2017   SpO2 100%   Physical Exam  Constitutional: She is oriented to person, place, and time. She appears well-developed and well-nourished. No distress.  HENT:  Head: Normocephalic and atraumatic.  Mouth/Throat: Oropharynx is clear and moist.  Bilateral tonsils are erythematous and edematous.  No peritonsillar abscess noted.  Minimal exudates on bilateral tonsils noted.  Normal phonation.  Normal swallowing of secretions noted.   Of note - piercing to right nare is mildly inflamed.  Eyes: Pupils are equal, round, and reactive to light. Conjunctivae and EOM are normal.  Neck: Normal range of motion. Neck supple.  Cardiovascular: Normal rate, regular rhythm and normal heart sounds.  Pulmonary/Chest: Effort normal and breath sounds normal. No respiratory distress.  Abdominal: Soft. She exhibits no distension. There is no tenderness.  Musculoskeletal: Normal range of motion. She exhibits no edema or deformity.  Neurological: She is alert and oriented to person, place, and time.  Skin: Skin is warm and dry.  Psychiatric: She has a normal mood and affect.  Nursing note and vitals reviewed.    ED Treatments / Results  Labs (all labs ordered are listed, but only abnormal results are displayed) Labs Reviewed  RAPID STREP SCREEN (NOT AT [facility])  CULTURE, GROUP A STREP [facility])    EKG None  Radiology No results found.  Procedures Procedures (including critical care time)  Medications Ordered  in ED Medications - No data to display   Initial Impression / Assessment and Plan / ED Course  I have reviewed the triage vital signs and the nursing notes.  Pertinent labs & imaging results that were available during my care of the patient were reviewed by me and considered in my medical decision making (see chart for details).     MDM  Screen complete  Patient is presenting with symptoms and exam consistent with pharyngitis.  Patient is strep negative today, however given the appearance of her tonsils I will treat with a course of Augmentin.  Patient understands the need for close follow-up.  Strict return precautions given and understood.  She is otherwise comfortable following her ED evaluation and is nontoxic in appearance.  She appears to be safe to discharge home.  Patient also advised that her nasal piercing appears to be inflamed.  Advised local wound care.  Patient advised that if the inflammation does not resolve her piercing should be removed.  She understood these precautionary advisements as well.  Final Clinical Impressions(s) / ED Diagnoses   Final diagnoses:  Pharyngitis, unspecified etiology    ED Discharge Orders        Ordered    amoxicillin-clavulanate (AUGMENTIN) 875-125 MG tablet  Every 12 hours     07/30/17 1401       Valarie Merino, MD 07/30/17 1406

## 2017-08-01 LAB — CULTURE, GROUP A STREP (THRC)

## 2017-08-02 ENCOUNTER — Telehealth: Payer: Self-pay | Admitting: *Deleted

## 2017-08-02 NOTE — Telephone Encounter (Signed)
Post ED Visit - Positive Culture Follow-up  Culture report reviewed by antimicrobial stewardship pharmacist:  []  Elenor Quinones, Pharm.D. []  Heide Guile, Pharm.D., BCPS AQ-ID []  Parks Neptune, Pharm.D., BCPS []  Alycia Rossetti, Pharm.D., BCPS []  [place], Pharm.D., BCPS, AAHIVP [x]  Legrand Como, Pharm.D., BCPS, AAHIVP []  Salome Arnt, PharmD, BCPS []  Jalene Mullet, PharmD []  Vincenza Hews, PharmD, BCPS  Positive strep culture Treated with Amoxicillin-Pot Clavulanate, organism sensitive to the same and no further patient follow-up is required at this time.  Harlon Flor [facility] 08/02/2017, 1:36 PM

## 2018-11-21 IMAGING — DX DG KNEE COMPLETE 4+V*L*
4 series · 4 of 4 positions shown · non-contrast
Comparison: 10/15/2012

CLINICAL DATA: Posterior left knee pain

EXAM:
LEFT KNEE - COMPLETE 4+ VIEW

[t knee ap left]
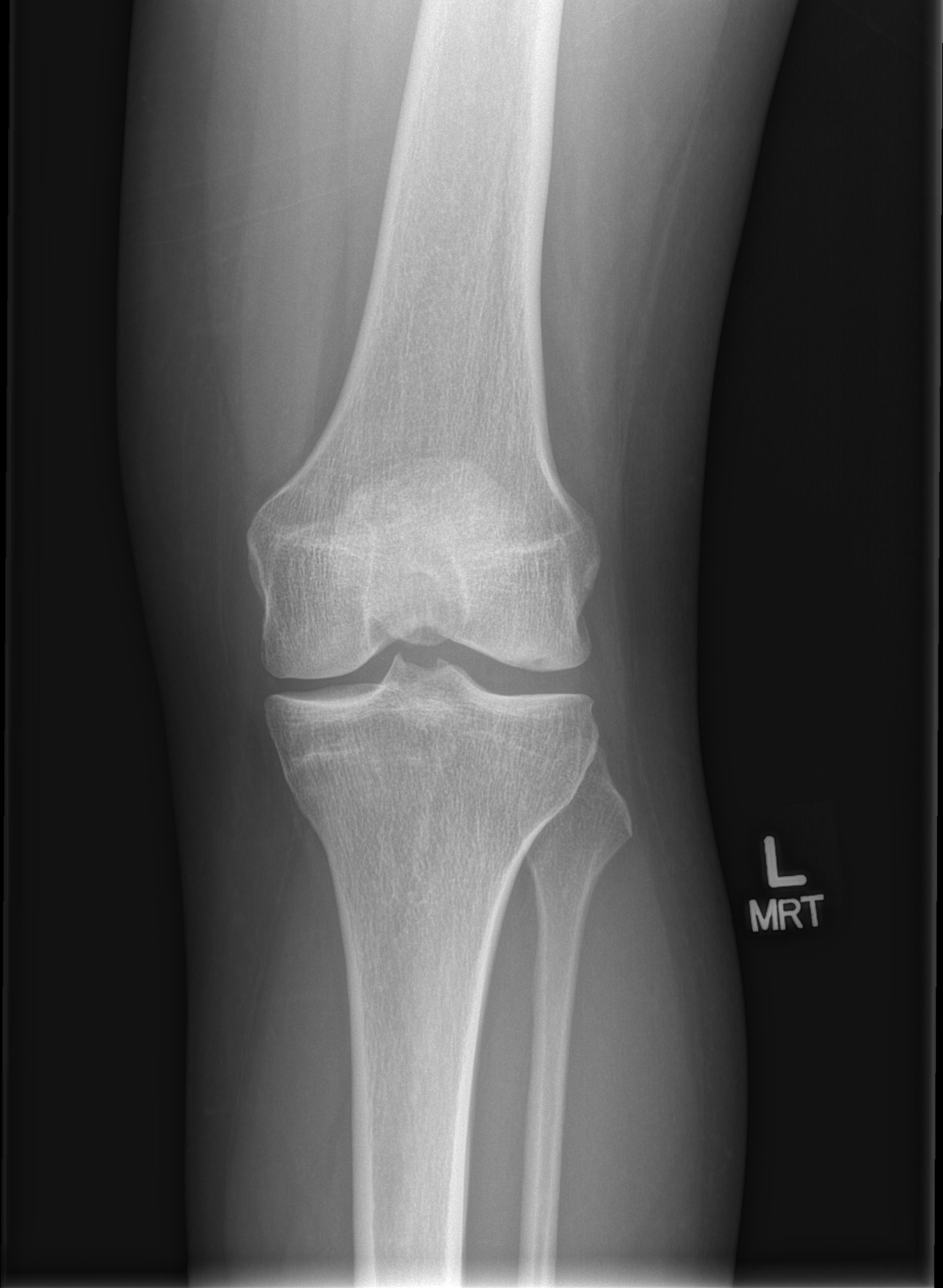

[t knee obl left]
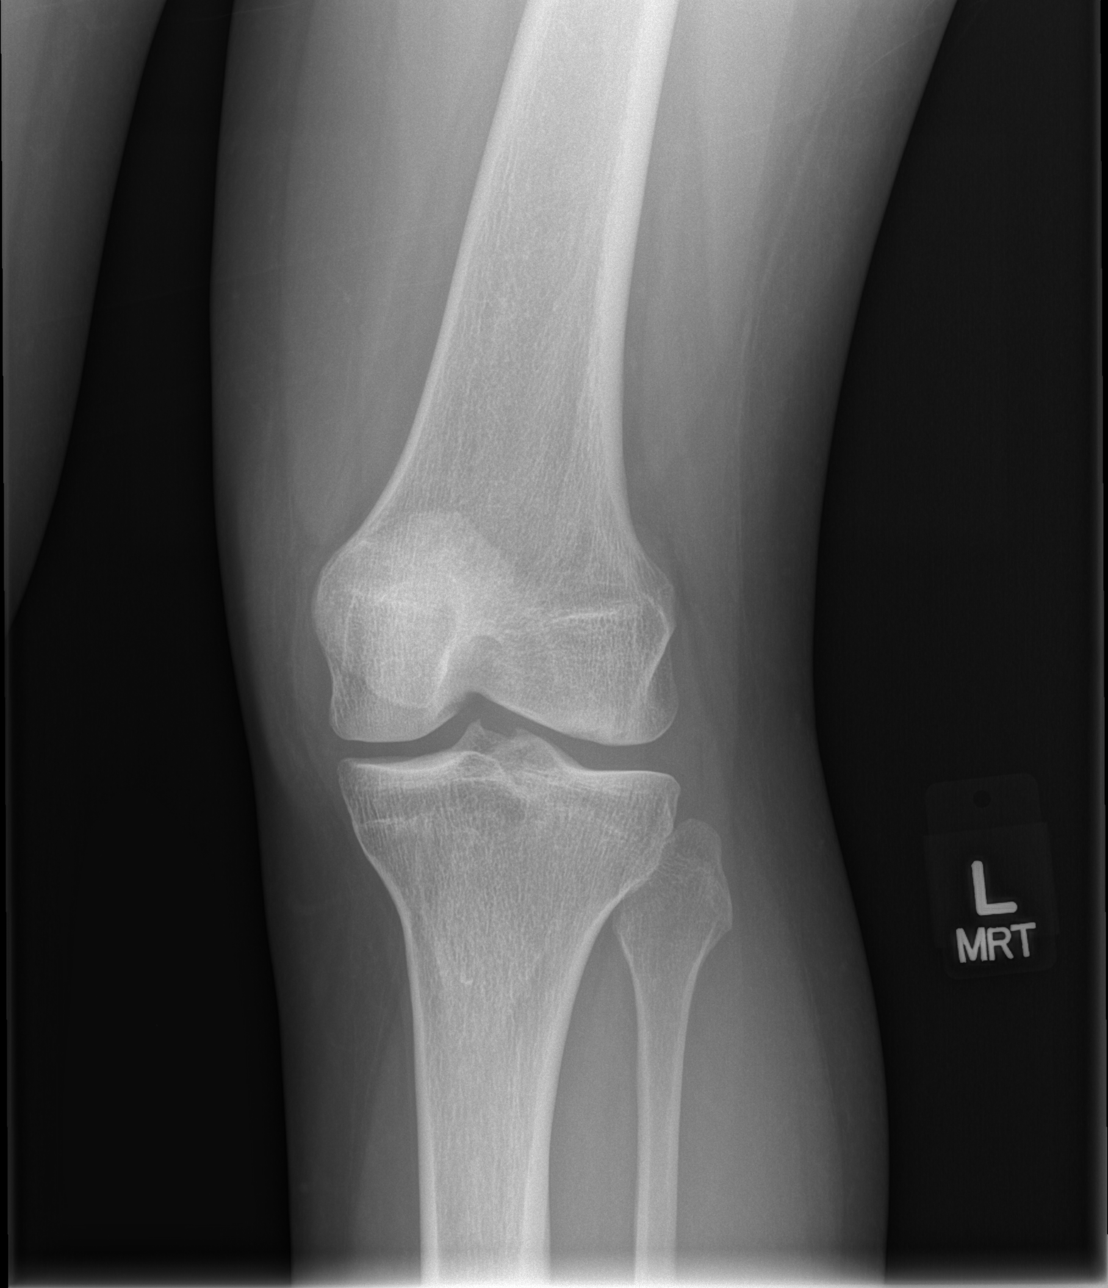

[t knee lat left (1 of 2)]
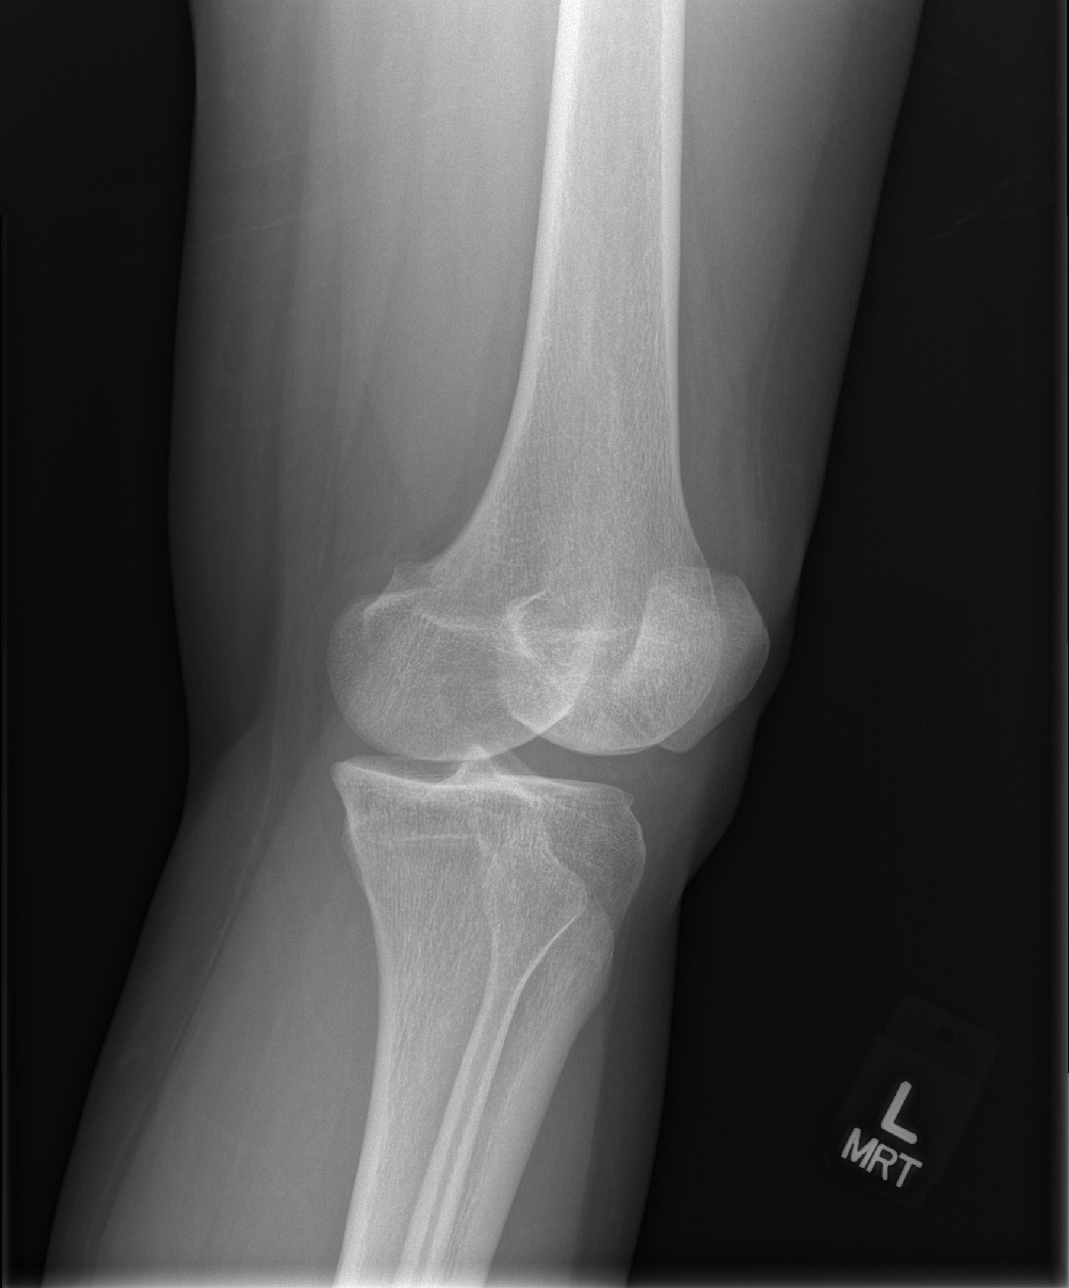

[t knee lat left (2 of 2)]
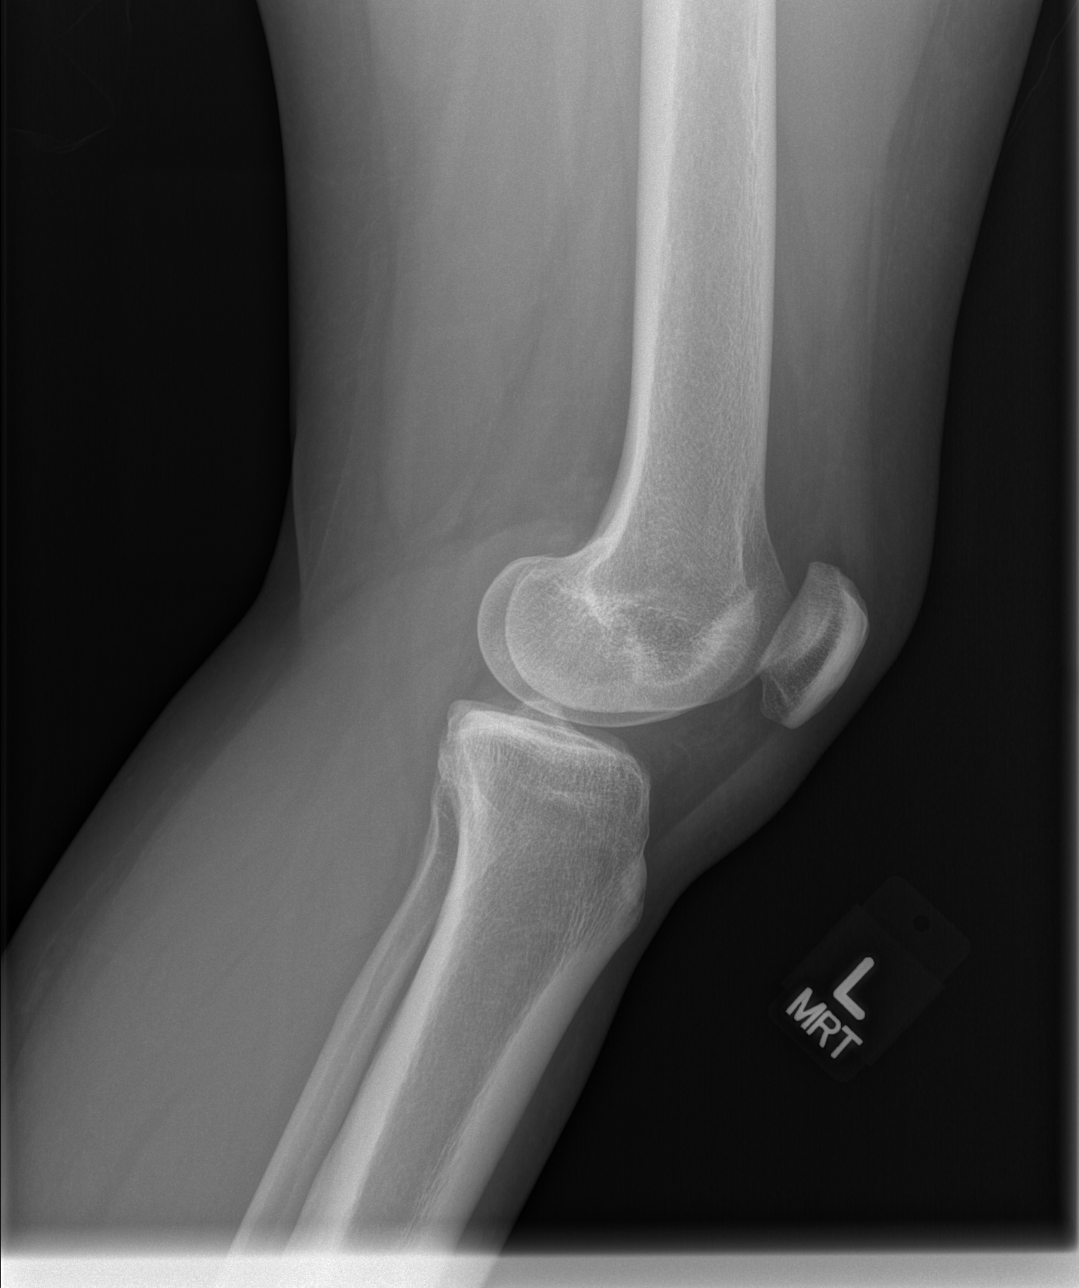

[4 of 4 positions shown; findings below may reference images not displayed]

FINDINGS: No acute displaced fracture or malalignment. Subtle subarticular
sclerosis and lucency within the lateral femoral condyle of the
distal femur is noted, this is in the region of previous
osteochondral lesion. No significant joint effusion.
IMPRESSION: 1. No acute displaced fracture or malalignment
2. Subtle subarticular sclerosis and lucency within the lateral
femoral condyles, compatible with history of osteochondral lesion.

## 2019-02-22 ENCOUNTER — Encounter (HOSPITAL_COMMUNITY): Payer: Self-pay | Admitting: *Deleted

## 2019-02-22 ENCOUNTER — Inpatient Hospital Stay (HOSPITAL_COMMUNITY)
Admission: AD | Admit: 2019-02-22 | Discharge: 2019-02-22 | Payer: 59 | Source: Home / Self Care | Attending: Obstetrics and Gynecology | Admitting: Obstetrics and Gynecology

## 2019-02-22 ENCOUNTER — Emergency Department (HOSPITAL_COMMUNITY)
Admission: EM | Admit: 2019-02-22 | Discharge: 2019-02-22 | Disposition: A | Discharge status: Home / Self Care | Payer: 59 | Attending: Emergency Medicine | Admitting: Emergency Medicine

## 2019-02-22 DIAGNOSIS — N920 Excessive and frequent menstruation with regular cycle: Secondary | ICD-10-CM

## 2019-02-22 DIAGNOSIS — N939 Abnormal uterine and vaginal bleeding, unspecified: Secondary | ICD-10-CM | POA: Diagnosis present

## 2019-02-22 LAB — COMPREHENSIVE METABOLIC PANEL
ALT: 20 U/L (ref 0–44)
AST: 23 U/L (ref 15–41)
Albumin: 3.3 g/dL — ABNORMAL LOW (ref 3.5–5.0)
Alkaline Phosphatase: 48 U/L (ref 38–126)
Anion gap: 8 (ref 5–15)
BUN: 21 mg/dL — ABNORMAL HIGH (ref 6–20)
CO2: 25 mmol/L (ref 22–32)
Calcium: 8.6 mg/dL — ABNORMAL LOW (ref 8.9–10.3)
Chloride: 107 mmol/L (ref 98–111)
Creatinine, Ser: 0.84 mg/dL (ref 0.44–1.00)
GFR calc Af Amer: >60 mL/min (ref >60)
GFR calc non Af Amer: >60 mL/min (ref >60)
Glucose, Bld: 96 mg/dL (ref 70–99)
Potassium: 3.7 mmol/L (ref 3.5–5.1)
Sodium: 140 mmol/L (ref 135–145)
Total Bilirubin: 0.4 mg/dL (ref 0.3–1.2)
Total Protein: 6.1 g/dL — ABNORMAL LOW (ref 6.5–8.1)

## 2019-02-22 LAB — CBC WITH DIFFERENTIAL/PLATELET
Abs Immature Granulocytes: 0.01 10*3/uL (ref 0.00–0.07)
Basophils Absolute: 0 10*3/uL (ref 0.0–0.1)
Basophils Relative: 1 %
Eosinophils Absolute: 0.2 10*3/uL (ref 0.0–0.5)
Eosinophils Relative: 3 %
HCT: 42 % (ref 36.0–46.0)
Hemoglobin: 14 g/dL (ref 12.0–15.0)
Immature Granulocytes: 0 %
Lymphocytes Relative: 32 %
Lymphs Abs: 1.4 10*3/uL (ref 0.7–4.0)
MCH: 30 pg (ref 26.0–34.0)
MCHC: 33.3 g/dL (ref 30.0–36.0)
MCV: 90.1 fL (ref 80.0–100.0)
Monocytes Absolute: 0.3 10*3/uL (ref 0.1–1.0)
Monocytes Relative: 7 %
Neutro Abs: 2.6 10*3/uL (ref 1.7–7.7)
Neutrophils Relative %: 57 %
Platelets: 260 10*3/uL (ref 150–400)
RBC: 4.66 MIL/uL (ref 3.87–5.11)
RDW: 12 % (ref 11.5–15.5)
WBC: 4.5 10*3/uL (ref 4.0–10.5)
nRBC: 0 % (ref 0.0–0.2)

## 2019-02-22 LAB — URINALYSIS, ROUTINE W REFLEX MICROSCOPIC
Bilirubin Urine: NEGATIVE
Glucose, UA: NEGATIVE mg/dL
Ketones, ur: NEGATIVE mg/dL
Leukocytes,Ua: NEGATIVE
Nitrite: NEGATIVE
Protein, ur: NEGATIVE mg/dL
RBC / HPF: >50 RBC/hpf — ABNORMAL HIGH (ref 0–5)
Specific Gravity, Urine: 1.029 (ref 1.005–1.030)
pH: 5 (ref 5.0–8.0)

## 2019-02-22 LAB — I-STAT BETA HCG BLOOD, ED (MC, WL, AP ONLY): I-stat hCG, quantitative: <5 m[IU]/mL (ref <5)

## 2019-02-22 LAB — LIPASE, BLOOD: Lipase: 32 U/L (ref 11–51)

## 2019-02-22 NOTE — ED Provider Notes (Signed)
[facility] EMERGENCY DEPARTMENT Provider Note   CSN: [id number] Arrival date & time: 02/22/19  1042     History   Chief Complaint Chief Complaint  Patient presents with  . Vaginal Bleeding    HPI Carla [name] is a 28 y.o. female presenting to the emergency department with complaint of heavy menstrual bleeding that began this morning.  She began her scheduled regular menstrual period today, however noticed it was heavier than usual.  LMP prior to today was 01/26/2019.  She reports some suprapubic abdominal pain that is worse with urination as well.  She has not treated her symptoms by medications.  She is not on anticoagulation.  She has history of cysts on her ovaries and had her right ovary removed due to concern for malignant cyst.  No fevers, lightheadedness, or other associated symptoms.  She does have a gynecologist with but she can follow-up with outpatient.     The history is provided by the patient.    Past Medical History:  Diagnosis Date  . Anxiety   . HELLP syndrome in third trimester 12/31/2011  . Neuromuscular disorder (HCC)    complaints of leg pain since 6th grade  . 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 Pregnancy headache   . Pregnancy induced hypertension   . S/P cesarean section 04/01/2014  . S/P emergency cesarean section 01/01/2012    Patient Active Problem List   Diagnosis Date Noted  . S/P cesarean section 04/01/2014  . 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 NST (non-stress test) nonreactive   . [redacted] weeks gestation of pregnancy   . Migraine, unspecified, without mention of intractable migraine without mention of status migrainosus 01/25/2014  . Pregnancy headache   . IUP (intrauterine pregnancy), incidental 08/23/2013  . Ovarian cancer on right ([place]) 01/20/2012  . S/P emergency cesarean section 01/01/2012  . HELLP syndrome in third trimester 12/31/2011    Past Surgical History:   Procedure Laterality Date  . CESAREAN SECTION  01/01/2012   Procedure: CESAREAN SECTION;  Surgeon: Thornell Sartorius, MD;  Location: [facility];  Service: Gynecology;  Laterality: N/A;  Primary cesarean section of baby  boy at 20  . CESAREAN SECTION Right 04/01/2014   Procedure: CESAREAN SECTION, with Right Salpingo Oophorectomy;  Surgeon: Janyth Contes, MD;  Location: [facility];  Service: Obstetrics;  Laterality: Right;  . ovarian cystectomy       OB History    Gravida  3   Para  3   Term  2   Preterm  1   AB      Living  3     SAB      TAB      Ectopic      Multiple  0   Live Births  3            Home Medications    Prior to Admission medications   Medication Sig Start Date End Date Taking? Authorizing Provider  acetaminophen (TYLENOL) 500 MG tablet Take 500 mg by mouth every 6 (six) hours as needed. pain    [provider]  amoxicillin-clavulanate (AUGMENTIN) 875-125 MG tablet Take 1 tablet by mouth every 12 (twelve) hours. 07/30/17   Valarie Merino, MD  dicyclomine (BENTYL) 20 MG tablet Take 1 tablet (20 mg total) by mouth 2 (two) times daily as needed for spasms (and abdominal pain). 07/01/17   Katy Apo, NP  medroxyPROGESTERone (DEPO-PROVERA) 150 MG/ML injection Inject 150  mg into the muscle every 3 (three) months.    [provider]  ondansetron (ZOFRAN) 8 MG tablet Take 1 tablet (8 mg total) by mouth every 8 (eight) hours as needed for nausea or vomiting. 07/01/17   Katy Apo, NP    Family History Family History  Problem Relation Age of Onset  . ADD / ADHD Mother   . ADD / ADHD Brother   . Diabetes Maternal Aunt   . Diabetes Maternal Uncle   . High blood pressure Other   . High Cholesterol Other     Social History Social History   Tobacco Use  . Smoking status: Never Smoker  . Smokeless tobacco: Never Used  Substance Use Topics  . Alcohol use: No  . Drug use: No     Allergies   Latex   Review of Systems  Review of Systems  Constitutional: Negative for fever.  Gastrointestinal: Positive for abdominal pain.  Genitourinary: Positive for vaginal bleeding.  All other systems reviewed and are negative.    Physical Exam Updated Vital Signs BP 110/80   Pulse 67   Temp 98.4 F (36.9 C) (Oral)   Resp 16   Ht 5\' 6"  (1.676 m)   Wt 71.7 kg   LMP 02/22/2019 (Exact Date)   SpO2 99%   BMI 25.50 kg/m   Physical Exam Vitals signs and nursing note reviewed.  Constitutional:      General: She is not in acute distress.    Appearance: She is well-developed. She is not ill-appearing.  HENT:     Head: Normocephalic and atraumatic.  Eyes:     Conjunctiva/sclera: Conjunctivae normal.  Cardiovascular:     Rate and Rhythm: Normal rate and regular rhythm.  Pulmonary:     Effort: Pulmonary effort is normal. No respiratory distress.     Breath sounds: Normal breath sounds.  Abdominal:     General: Bowel sounds are normal.     Palpations: Abdomen is soft.     Tenderness: There is no abdominal tenderness. There is no guarding or rebound.  Genitourinary:    Comments: Exam performed with RN chaperone present.  There is some dark red blood present at the cervical os though no profuse bleeding or obvious active bleeding from the cervical os during my exam.  There are no vaginal lacerations.  Some mild generalized discomfort on bimanual though no chandelier sign. Skin:    General: Skin is warm.  Neurological:     Mental Status: She is alert.  Psychiatric:        Behavior: Behavior normal.      ED Treatments / Results  Labs (all labs ordered are listed, but only abnormal results are displayed) Labs Reviewed  URINALYSIS, ROUTINE W REFLEX MICROSCOPIC - Abnormal; Notable for the following components:      Result Value   Hgb urine dipstick LARGE (*)    RBC / HPF >50 (*)    Bacteria, UA RARE (*)    All other components within normal limits  COMPREHENSIVE METABOLIC PANEL - Abnormal; Notable for the  following components:   BUN 21 (*)    Calcium 8.6 (*)    Total Protein 6.1 (*)    Albumin 3.3 (*)    All other components within normal limits  CBC WITH DIFFERENTIAL/PLATELET  LIPASE, BLOOD  I-STAT BETA HCG BLOOD, ED ([facility], [facility], [facility] ONLY)    EKG None  Radiology No results found.  Procedures Procedures (including critical care time)  Medications Ordered in  ED Medications - No data to display   Initial Impression / Assessment and Plan / ED Course  I have reviewed the triage vital signs and the nursing notes.  Pertinent labs & imaging results that were available during my care of the patient were reviewed by me and considered in my medical decision making (see chart for details).       Patient presenting with heavier menstrual bleeding than usual that began today, her expected period.  She is overall well-appearing and in no distress.  Vital signs are stable.  Labs obtained in triage are very reassuring, no leukocytosis, normal hemoglobin of 14, no electrolyte derangements.  hCG is negative.  UA without signs of infection.  Pelvic exam is reassuring, no large pain, no profuse bleeding or vaginal lacerations.  Recommend outpatient follow-up with patient's gynecologist and discuss strict return precautions.  She is agreeable to plan and safe for discharge.  Discussed results, findings, treatment and follow up. Patient advised of return precautions. Patient verbalized understanding and agreed with plan.   Final Clinical Impressions(s) / ED Diagnoses   Final diagnoses:  Menorrhagia with regular cycle    ED Discharge Orders    None       Lenox Ladouceur, [place] N, PA-C 02/22/19 1555    Lucrezia Starch, MD 02/26/19 [phone]

## 2019-02-22 NOTE — ED Notes (Signed)
Patient verbalizes understanding of discharge instructions . Opportunity for questions and answers were provided . Armband removed by staff ,Pt discharged from ED. W/C  offered at D/C  and Declined W/C at D/C and was escorted to lobby by RN.  

## 2019-02-22 NOTE — Discharge Instructions (Addendum)
Please follow-up with your gynecologist regarding your heavier menstrual bleeding today. If you develop severe lightheadedness or significantly worsening bleeding that does not improve, you can return to the emergency department for further evaluation.

## 2019-02-22 NOTE — ED Triage Notes (Signed)
Pt began her period this am and states that it was unusually heavy.  Pt reports that she has soaked 4 pads since she woke up this am at 4am.  Pt states that this is a regularly scheduled period (previous one was 9/25).  Pt has sharp pains with this, especially when going to void.  Pt has also had intermittent headache for one week.

## 2019-03-23 LAB — OB RESULTS CONSOLE GC/CHLAMYDIA: Chlamydia: NEGATIVE

## 2019-03-23 LAB — RESULTS CONSOLE HPV: CHL HPV: NEGATIVE

## 2019-03-23 LAB — HM HIV SCREENING LAB: HM HIV Screening: NEGATIVE

## 2019-03-23 LAB — HM HEPATITIS C SCREENING LAB: HM Hepatitis Screen: NEGATIVE

## 2019-03-23 LAB — HM PAP SMEAR

## 2020-01-23 ENCOUNTER — Other Ambulatory Visit: Payer: 59

## 2020-01-23 DIAGNOSIS — Z20822 Contact with and (suspected) exposure to covid-19: Secondary | ICD-10-CM

## 2020-01-25 LAB — SARS-COV-2, NAA 2 DAY TAT

## 2020-01-25 LAB — NOVEL CORONAVIRUS, NAA: SARS-CoV-2, NAA: NOT DETECTED

## 2022-10-07 ENCOUNTER — Ambulatory Visit: Payer: 59 | Admitting: Student

## 2023-04-26 ENCOUNTER — Other Ambulatory Visit (HOSPITAL_COMMUNITY)
Admission: RE | Admit: 2023-04-26 | Discharge: 2023-04-26 | Disposition: A | Discharge status: Home / Self Care | Payer: Medicaid Other | Source: Ambulatory Visit | Attending: Internal Medicine | Admitting: Internal Medicine

## 2023-04-26 ENCOUNTER — Ambulatory Visit: Payer: 59 | Attending: Nurse Practitioner

## 2023-04-26 ENCOUNTER — Ambulatory Visit (INDEPENDENT_AMBULATORY_CARE_PROVIDER_SITE_OTHER): Payer: Medicaid Other | Admitting: Nurse Practitioner

## 2023-04-26 ENCOUNTER — Encounter: Payer: Self-pay | Admitting: Nurse Practitioner

## 2023-04-26 VITALS — BP 106/68 | HR 90 | Temp 97.3°F | Ht 67.0 in | Wt 204.0 lb

## 2023-04-26 DIAGNOSIS — Z124 Encounter for screening for malignant neoplasm of cervix: Secondary | ICD-10-CM

## 2023-04-26 DIAGNOSIS — Z113 Encounter for screening for infections with a predominantly sexual mode of transmission: Secondary | ICD-10-CM | POA: Insufficient documentation

## 2023-04-26 DIAGNOSIS — R079 Chest pain, unspecified: Secondary | ICD-10-CM

## 2023-04-26 DIAGNOSIS — G4700 Insomnia, unspecified: Secondary | ICD-10-CM

## 2023-04-26 DIAGNOSIS — E669 Obesity, unspecified: Secondary | ICD-10-CM | POA: Insufficient documentation

## 2023-04-26 DIAGNOSIS — Z1321 Encounter for screening for nutritional disorder: Secondary | ICD-10-CM

## 2023-04-26 DIAGNOSIS — Z130 Encounter for screening for diseases of the blood and blood-forming organs and certain disorders involving the immune mechanism: Secondary | ICD-10-CM

## 2023-04-26 DIAGNOSIS — R002 Palpitations: Secondary | ICD-10-CM

## 2023-04-26 DIAGNOSIS — J309 Allergic rhinitis, unspecified: Secondary | ICD-10-CM | POA: Insufficient documentation

## 2023-04-26 DIAGNOSIS — H6123 Impacted cerumen, bilateral: Secondary | ICD-10-CM

## 2023-04-26 DIAGNOSIS — F32A Depression, unspecified: Secondary | ICD-10-CM

## 2023-04-26 DIAGNOSIS — Z13228 Encounter for screening for other metabolic disorders: Secondary | ICD-10-CM

## 2023-04-26 DIAGNOSIS — Z1329 Encounter for screening for other suspected endocrine disorder: Secondary | ICD-10-CM

## 2023-04-26 DIAGNOSIS — Z0000 Encounter for general adult medical examination without abnormal findings: Secondary | ICD-10-CM | POA: Diagnosis not present

## 2023-04-26 DIAGNOSIS — F419 Anxiety disorder, unspecified: Secondary | ICD-10-CM

## 2023-04-26 MED ORDER — DEBROX 6.5 % OT SOLN: 5.0000 [drp] | Freq: Two times a day (BID) | OTIC | 0 refills | Status: DC

## 2023-04-26 MED ORDER — FLUTICASONE PROPIONATE 50 MCG/ACT NA SUSP: 2.0000 | Freq: Every day | NASAL | 6 refills | Status: DC

## 2023-04-26 NOTE — Assessment & Plan Note (Signed)
Wt Readings from Last 3 Encounters:  04/26/23 204 lb (92.5 kg)  02/22/19 158 lb (71.7 kg)  03/23/16 170 lb (77.1 kg)   Body mass index is 31.95 kg/m.  Patient counseled on low-carb diet Encouraged engage in regular moderate exercises at least  150 minutes weekly as tolerated

## 2023-04-26 NOTE — Patient Instructions (Addendum)
Please maintain simple sleep hygiene. - Maintain dark and non-noisy environment in the bedroom. - Please use the bedroom for sleep and sexual activity only. - Do not use electronic devices in the bedroom. - Please take dinner at least 2 hours before bedtime. - Please avoid caffeinated products in the evening, including coffee, soft drinks. - Please try to maintain the regular sleep-wake cycle - Go to bed and wake up at the same time.   OK to use Debrox (peroxide) in the ear to loosen up wax. Also recommend using a bulb syringe (for removing boogers from baby's noses) to flush through warm water and vinegar (3-4:1 ratio). An alternative, though more expensive, is an elephant ear washer wax removal kit. Do not use Q-tips as this can impact wax further.   . Chest pain, unspecified type (Primary)  - LONG TERM MONITOR (3-14 DAYS); Future - EKG 12-Lead   Palpitations  - LONG TERM MONITOR (3-14 DAYS); Future   Insomnia, unspecified type  - Home sleep test; Future    Excessive wax in both ears  - carbamide peroxide (DEBROX) 6.5 % OTIC solution; Place 5 drops into both ears 2 (two) times daily.  Dispense: 15 mL; Refill: 0   . Allergic rhinitis, unspecified seasonality, unspecified trigger  - fluticasone (FLONASE) 50 MCG/ACT nasal spray; Place 2 sprays into both nostrils daily.  Dispense: 16 g; Refill: 6     It is important that you exercise regularly at least 30 minutes 5 times a week as tolerated  Think about what you will eat, plan ahead. Choose " clean, green, fresh or frozen" over canned, processed or packaged foods which are more sugary, salty and fatty. 70 to 75% of food eaten should be vegetables and fruit. Three meals at set times with snacks allowed between meals, but they must be fruit or vegetables. Aim to eat over a 12 hour period , example 7 am to 7 pm, and STOP after  your last meal of the day. Drink water,generally about 64 ounces per day, no other drink is as healthy.  Fruit juice is best enjoyed in a healthy way, by EATING the fruit.  Thanks for choosing [facility] we consider it a privelige to serve you.

## 2023-04-26 NOTE — Assessment & Plan Note (Signed)
EKG shows normal sinus rhythm rate of 83 beats per minutes I have no concern for acute ischemia 1. Chest pain, unspecified type (Primary)  - LONG TERM MONITOR (3-14 DAYS); Future - EKG 12-Lead  2. Palpitations  - LONG TERM MONITOR (3-14 DAYS); Future

## 2023-04-26 NOTE — Progress Notes (Signed)
New Patient Office Visit  Subjective:  Patient ID: Carla [name], female    DOB: 07-23-1990  Age: 32 y.o. MRN: [id number]  CC:  Chief Complaint  Patient presents with  . Annual Exam    fasting    HPI Carla [name] is a 32 y.o. 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  Who presents for a CPE and to establish care for her chronic medical condition  Insomnia.  Patient complains of trouble sleeping gets 3 to 4 hours of sleep nightly for the past 3 years.  She does not know if she has apnea, she denies snoring.  She reports not feeling well rested when she wakes up in the morning and has intermittent headaches in the morning.  She has not taken any medication for insomnia because she does not want to be addicted to medications  Chest pain/palpitation.  Patient complains of intermittent random left-sided chest pain associated with shortness of breath that started about 3 weeks ago, she also reports heart flutters when sleeping at night.  Stated that she had an EKG done a year ago at an urgent care that was abnormal.  Sitting down helps relieve her chest pain.  Seasonal allergies.  Patient complains of runny nose, sneezing, stuffy nose.  Currently not on medications for allergies   Anxiety and depression.  Stated that she has had anxiety and depression for years, she attributes her depression and anxiety to her last relationship, being on [employer], losing her brother.  She denies SI, HI, she starts counseling today, never been on medication  Due for Pap smear Pap smear completed in the office today, she is also interested in screening for STD.  She declined a flu vaccine  She is on a general diet,  does not exercise due to her chronic knee pain, she has had 2 surgeries for her left knee injury that she sustained at work.  She is currently on workers compensation.  Takes Percocet and Vicodin as needed for pain, medical use prescribed by orthopedics.      Past Medical History:  Diagnosis Date  . Anxiety   . HELLP syndrome in third trimester 12/31/2011  . Neuromuscular disorder (HCC)    complaints of leg pain since 6th grade  . 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 Pregnancy headache   . Pregnancy induced hypertension   . S/P cesarean section 04/01/2014  . S/P emergency cesarean section 01/01/2012    Past Surgical History:  Procedure Laterality Date  . CESAREAN SECTION  01/01/2012   Procedure: CESAREAN SECTION;  Surgeon: Sherron Monday, MD;  Location: [facility];  Service: Gynecology;  Laterality: N/A;  Primary cesarean section of baby  boy at 68  . CESAREAN SECTION Right 04/01/2014   Procedure: CESAREAN SECTION, with Right Salpingo Oophorectomy;  Surgeon: Sherian Rein, MD;  Location: [facility];  Service: Obstetrics;  Laterality: Right;  . JOINT REPLACEMENT Left    Knee  . ovarian cystectomy      Family History  Problem Relation Age of Onset  . ADD / ADHD Mother   . High blood pressure Mother   . ADD / ADHD Brother   . Diabetes Maternal Aunt   . Diabetes Maternal Uncle   . Hypercholesterolemia Maternal Grandmother   . High blood pressure  Maternal Grandmother   . Diabetes Maternal Grandmother   . High blood pressure Other   . High Cholesterol Other     Social History   Socioeconomic History  . Marital status: Single    Spouse name: Not on file  . Number of children: 3  . Years of education: Tech  . Highest education level: Not on file  Occupational History    Comment: Home maker   Tobacco Use  . Smoking status: Never  . Smokeless tobacco: Never  Substance and Sexual Activity  . Alcohol use: No  . Drug use: No  . Sexual activity: Yes   Other Topics Concern  . Not on file  Social History Narrative   Patient lives at home with her husband Apolinar Junes). Patient is a homemaker.   Surveyor, minerals.   Right handed.   Caffeine 2-3 times a week.    Social Drivers of Corporate investment banker Strain: Not on file  Food Insecurity: Not on file  Transportation Needs: Not on file  Physical Activity: Not on file  Stress: Not on file  Social Connections: Not on file  Intimate Partner Violence: Not on file    ROS Review of Systems  Constitutional:  Negative for activity change, appetite change, chills, fatigue and fever.  HENT:  Positive for congestion, rhinorrhea and sneezing. Negative for dental problem, ear discharge, ear pain, hearing loss, sinus pressure, sinus pain and sore throat.   Eyes:  Negative for pain, discharge, redness and itching.  Respiratory:  Negative for cough, chest tightness, shortness of breath and wheezing.   Cardiovascular:  Positive for chest pain and palpitations. Negative for leg swelling.  Gastrointestinal:  Negative for abdominal distention, abdominal pain, anal bleeding, blood in stool, constipation, diarrhea, nausea, rectal pain and vomiting.  Endocrine: Negative for cold intolerance, heat intolerance, polydipsia, polyphagia and polyuria.  Genitourinary:  Negative for difficulty urinating, dysuria, flank pain, frequency, hematuria, menstrual problem, pelvic pain and vaginal bleeding.  Musculoskeletal:  Positive for arthralgias. Negative for back pain, gait problem, joint swelling and myalgias.  Skin:  Negative for color change, pallor, rash and wound.  Allergic/Immunologic: Negative for food allergies and immunocompromised state.  Neurological:  Positive for headaches. Negative for dizziness, tremors, facial asymmetry and weakness.  Hematological:  Negative for adenopathy. Does not bruise/bleed easily.  Psychiatric/Behavioral:  Positive for sleep disturbance. Negative for agitation,  behavioral problems, confusion, decreased concentration, hallucinations, self-injury and suicidal ideas. The patient is nervous/anxious.     Objective:   Today's Vitals: BP 106/68   Pulse 90   Temp (!) 97.3 F (36.3 C)   Ht 5\' 7"  (1.702 m)   Wt 204 lb (92.5 kg)   SpO2 100%   BMI 31.95 kg/m   Physical Exam Vitals and nursing note reviewed. Exam conducted with a chaperone present.  Constitutional:      General: She is not in acute distress.    Appearance: Normal appearance. She is obese. She is not ill-appearing, toxic-appearing or diaphoretic.  HENT:     Right Ear: Tympanic membrane, ear canal and external ear normal. There is impacted cerumen.     Left Ear: Tympanic membrane, ear canal and external ear normal. There is impacted cerumen.     Nose: Congestion and rhinorrhea present.     Right Turbinates: Enlarged.     Left Turbinates: Enlarged.     Mouth/Throat:     Mouth: Mucous membranes are moist.     Pharynx: Oropharynx is clear. No oropharyngeal exudate  or posterior oropharyngeal erythema.     Tonsils: No tonsillar exudate or tonsillar abscesses. 3+ on the right. 3+ on the left.     Comments: Chronically enlarged tonsils.  Eyes:     General: No scleral icterus.       Right eye: No discharge.        Left eye: No discharge.     Extraocular Movements: Extraocular movements intact.     Conjunctiva/sclera: Conjunctivae normal.  Neck:     Vascular: No carotid bruit.  Cardiovascular:     Rate and Rhythm: Normal rate and regular rhythm.     Pulses: Normal pulses.     Heart sounds: Normal heart sounds. No murmur heard.    No friction rub. No gallop.  Pulmonary:     Effort: Pulmonary effort is normal. No respiratory distress.     Breath sounds: Normal breath sounds. No stridor. No wheezing, rhonchi or rales.  Chest:     Chest wall: No mass, lacerations, deformity, swelling, tenderness or edema.  Breasts:    Tanner Score is 5.     Breasts are symmetrical.     Right:  Normal. No swelling, bleeding, inverted nipple, mass, nipple discharge, skin change or tenderness.     Left: Normal. No swelling, bleeding, inverted nipple, mass, nipple discharge, skin change or tenderness.  Abdominal:     General: Bowel sounds are normal. There is no distension.     Palpations: Abdomen is soft. There is no mass.     Tenderness: There is no abdominal tenderness. There is no right CVA tenderness, left CVA tenderness, guarding or rebound.     Hernia: No hernia is present. There is no hernia in the left inguinal area or right inguinal area.  Genitourinary:    General: Normal vulva.     Exam position: Lithotomy position.     Pubic Area: No rash or pubic lice.      Tanner stage (genital): 5.     Labia:        Right: No rash, tenderness, lesion or injury.        Left: No rash, tenderness, lesion or injury.      Urethra: No prolapse, urethral pain, urethral swelling or urethral lesion.     Vagina: No signs of injury and foreign body. No vaginal discharge, erythema, tenderness, bleeding, lesions or prolapsed vaginal walls.     Cervix: No cervical motion tenderness, discharge, friability, lesion, erythema, cervical bleeding or eversion.     Uterus: Normal. Not enlarged, not fixed, not tender and no uterine prolapse.      Adnexa:        Right: No mass, tenderness or fullness.         Left: No mass, tenderness or fullness.    Musculoskeletal:        General: Tenderness present. No swelling, deformity or signs of injury.     Cervical back: Normal range of motion and neck supple. No rigidity or tenderness.     Right lower leg: No edema.     Left lower leg: No edema.     Comments: Left knee tenderness on range of motion, no redness or swelling noted has palpable pedal pulses.   Lymphadenopathy:     Cervical: No cervical adenopathy.     Upper Body:     Right upper body: No supraclavicular, axillary or pectoral adenopathy.     Left upper body: No supraclavicular, axillary or  pectoral adenopathy.     Lower Body: No  right inguinal adenopathy. No left inguinal adenopathy.  Skin:    General: Skin is warm and dry.     Capillary Refill: Capillary refill takes less than 2 seconds.     Coloration: Skin is not jaundiced or pale.     Findings: No bruising, erythema, lesion or rash.  Neurological:     Mental Status: She is alert and oriented to person, place, and time.     Cranial Nerves: No cranial nerve deficit.     Sensory: No sensory deficit.     Motor: No weakness.     Coordination: Coordination normal.     Gait: Gait normal.     Deep Tendon Reflexes: Reflexes normal.  Psychiatric:        Mood and Affect: Mood normal.        Behavior: Behavior normal.        Thought Content: Thought content normal.        Judgment: Judgment normal.    Assessment & Plan:   Problem List Items Addressed This Visit       Respiratory   Allergic rhinitis    - fluticasone (FLONASE) 50 MCG/ACT nasal spray; Place 2 sprays into both nostrils daily.  Dispense: 16 g; Refill: 6       Relevant Medications   fluticasone (FLONASE) 50 MCG/ACT nasal spray     Nervous and Auditory   Excessive wax in both ears    - carbamide peroxide (DEBROX) 6.5 % OTIC solution; Place 5 drops into both ears 2 (two) times daily.  Dispense: 15 mL; Refill: 0       Relevant Medications   carbamide peroxide (DEBROX) 6.5 % OTIC solution     Other   Anxiety and depression   Flowsheet Row Office Visit from 04/26/2023 in [facility] Of [facility]  PHQ-9 Total Score 17          04/26/2023    8:41 AM  GAD 7 : Generalized Anxiety Score  Nervous, Anxious, on Edge 3  Control/stop worrying 3  Worry too much - different things 3  Trouble relaxing 3  Restless 3  Easily annoyed or irritable 3  Afraid - awful might happen 3  Total GAD 7 Score 21  Anxiety Difficulty Somewhat difficult  Starting counseling today Not interested in starting medication  today Follow-up in 3 months        Screening for endocrine, nutritional, metabolic and immunity disorder   Relevant Orders   HepB+HepC+HIV Panel   Chlamydia/Gonococcus/Trichomonas, NAA   RPR   Lipid panel   CBC   CMP14+EGFR   Chest pain   EKG shows normal sinus rhythm rate of 83 beats per minutes I have no concern for acute ischemia 1. Chest pain, unspecified type (Primary)  - LONG TERM MONITOR (3-14 DAYS); Future - EKG 12-Lead  2. Palpitations  - LONG TERM MONITOR (3-14 DAYS); Future       Relevant Orders   LONG TERM MONITOR (3-14 DAYS)   EKG 12-Lead   Insomnia   Home sleep study ordered Sleep hygiene discussed      Relevant Orders   Home sleep test   Screening for cervical cancer     Screening for cervical cancer  - Cytology - PAP([place])       Relevant Orders   Cytology - PAP([place])   Palpitations   EKG shows normal sinus rhythm rate of 83 beats per minutes I have no concern  for acute ischemia 1. Chest pain, unspecified type (Primary)  - LONG TERM MONITOR (3-14 DAYS); Future - EKG 12-Lead  2. Palpitations  - LONG TERM MONITOR (3-14 DAYS); Future      Relevant Orders   LONG TERM MONITOR (3-14 DAYS)   Annual physical exam - Primary   Annual exam as documented.  Counseling done include healthy lifestyle involving committing to 150 minutes of exercise per week, heart healthy diet, and attaining healthy weight. The importance of adequate sleep also discussed.  Regular use of seat belt and home safety were also discussed . Changes in health habits are decided on by patient with goals and time frames set for achieving them. Immunization and cancer screening  needs are specifically addressed at this visit.    Routine fasting labs ordered Pap smear completed Encouraged to consider getting the flu vaccine      Obesity (BMI 30-39.9)   Wt Readings from Last 3 Encounters:  04/26/23 204 lb (92.5 kg)  02/22/19 158 lb (71.7 kg)  03/23/16 170  lb (77.1 kg)   Body mass index is 31.95 kg/m.  Patient counseled on low-carb diet Encouraged engage in regular moderate exercises at least  150 minutes weekly as tolerated      Other Visit Diagnoses       Screen for STD (sexually transmitted disease)       Relevant Orders   HepB+HepC+HIV Panel   Chlamydia/Gonococcus/Trichomonas, NAA   RPR       Outpatient Encounter Medications as of 04/26/2023  Medication Sig  . acetaminophen (TYLENOL) 500 MG tablet Take 500 mg by mouth every 6 (six) hours as needed. pain  . carbamide peroxide (DEBROX) 6.5 % OTIC solution Place 5 drops into both ears 2 (two) times daily.  . fluticasone (FLONASE) 50 MCG/ACT nasal spray Place 2 sprays into both nostrils daily.  Marland Kitchen HYDROcodone-acetaminophen (NORCO/VICODIN) 5-325 MG tablet Take by mouth.  . Multiple Vitamins-Minerals (CENTRUM ADULT PO) Take by mouth.  . oxyCODONE-acetaminophen (PERCOCET/ROXICET) 5-325 MG tablet Take 1 tablet by mouth every 6 (six) hours as needed.  . [DISCONTINUED] amoxicillin-clavulanate (AUGMENTIN) 875-125 MG tablet Take 1 tablet by mouth every 12 (twelve) hours.  . [DISCONTINUED] dicyclomine (BENTYL) 20 MG tablet Take 1 tablet (20 mg total) by mouth 2 (two) times daily as needed for spasms (and abdominal pain).  . [DISCONTINUED] medroxyPROGESTERone (DEPO-PROVERA) 150 MG/ML injection Inject 150 mg into the muscle every 3 (three) months.  . [DISCONTINUED] ondansetron (ZOFRAN) 8 MG tablet Take 1 tablet (8 mg total) by mouth every 8 (eight) hours as needed for nausea or vomiting.   No facility-administered encounter medications on file as of 04/26/2023.    Follow-up: Return in about 3 months (around 07/25/2023) for ANXIETY, DEPRESSION.   Donell Beers, FNP

## 2023-04-26 NOTE — Progress Notes (Unsigned)
Enrolled for [employer] to mail a ZIO XT long term holter monitor to the patients address on file.   EP to read.

## 2023-04-26 NOTE — Assessment & Plan Note (Signed)
-   fluticasone (FLONASE) 50 MCG/ACT nasal spray; Place 2 sprays into both nostrils daily.  Dispense: 16 g; Refill: 6

## 2023-04-26 NOTE — Assessment & Plan Note (Signed)
Home sleep study ordered Sleep hygiene discussed

## 2023-04-26 NOTE — Assessment & Plan Note (Addendum)
Annual exam as documented.  Counseling done include healthy lifestyle involving committing to 150 minutes of exercise per week, heart healthy diet, and attaining healthy weight. The importance of adequate sleep also discussed.  Regular use of seat belt and home safety were also discussed . Changes in health habits are decided on by patient with goals and time frames set for achieving them. Immunization and cancer screening  needs are specifically addressed at this visit.    Routine fasting labs ordered Pap smear completed Encouraged to consider getting the flu vaccine

## 2023-04-26 NOTE — Assessment & Plan Note (Signed)
   Screening for cervical cancer  - Cytology - PAP([place])

## 2023-04-26 NOTE — Assessment & Plan Note (Signed)
Flowsheet Row Office Visit from 04/26/2023 in [facility] - [facility] Of [facility]  PHQ-9 Total Score 17          04/26/2023    8:41 AM  GAD 7 : Generalized Anxiety Score  Nervous, Anxious, on Edge 3  Control/stop worrying 3  Worry too much - different things 3  Trouble relaxing 3  Restless 3  Easily annoyed or irritable 3  Afraid - awful might happen 3  Total GAD 7 Score 21  Anxiety Difficulty Somewhat difficult  Starting counseling today Not interested in starting medication today Follow-up in 3 months

## 2023-04-26 NOTE — Assessment & Plan Note (Signed)
  carbamide peroxide (DEBROX) 6.5 % OTIC solution; Place 5 drops into both ears 2 (two) times daily.  Dispense: 15 mL; Refill: 0

## 2023-04-29 LAB — CBC
Hematocrit: 41.2 % (ref 34.0–46.6)
Hemoglobin: 12.9 g/dL (ref 11.1–15.9)
MCH: 26.4 pg — ABNORMAL LOW (ref 26.6–33.0)
MCHC: 31.3 g/dL — ABNORMAL LOW (ref 31.5–35.7)
MCV: 84 fL (ref 79–97)
Platelets: 356 10*3/uL (ref 150–450)
RBC: 4.89 x10E6/uL (ref 3.77–5.28)
RDW: 12.7 % (ref 11.7–15.4)
WBC: 4.7 10*3/uL (ref 3.4–10.8)

## 2023-04-29 LAB — HEPB+HEPC+HIV PANEL
Hep B C IgM: NEGATIVE
Hep B Core Total Ab: NEGATIVE
Hep B E Ab: NONREACTIVE
Hep B E Ag: NEGATIVE
Hep B Surface Ab, Qual: NONREACTIVE
Hep C Virus Ab: NONREACTIVE
Hepatitis B Surface Ag: NEGATIVE

## 2023-04-29 LAB — CMP14+EGFR
ALT: 19 [IU]/L (ref 0–32)
AST: 27 [IU]/L (ref 0–40)
Albumin: 3.9 g/dL (ref 3.9–4.9)
Alkaline Phosphatase: 78 [IU]/L (ref 44–121)
BUN/Creatinine Ratio: 16 (ref 9–23)
BUN: 14 mg/dL (ref 6–20)
Bilirubin Total: 0.2 mg/dL (ref 0.0–1.2)
CO2: 21 mmol/L (ref 20–29)
Calcium: 9.3 mg/dL (ref 8.7–10.2)
Chloride: 103 mmol/L (ref 96–106)
Creatinine, Ser: 0.9 mg/dL (ref 0.57–1.00)
Globulin, Total: 2.9 g/dL (ref 1.5–4.5)
Glucose: 98 mg/dL (ref 70–99)
Potassium: 4.2 mmol/L (ref 3.5–5.2)
Sodium: 139 mmol/L (ref 134–144)
Total Protein: 6.8 g/dL (ref 6.0–8.5)
eGFR: 88 mL/min/{1.73_m2} (ref >59)

## 2023-04-29 LAB — CHLAMYDIA/GONOCOCCUS/TRICHOMONAS, NAA
Chlamydia by NAA: NEGATIVE
Gonococcus by NAA: NEGATIVE
Trich vag by NAA: NEGATIVE

## 2023-04-29 LAB — LIPID PANEL
Chol/HDL Ratio: 3.7 {ratio} (ref 0.0–4.4)
Cholesterol, Total: 196 mg/dL (ref 100–199)
HDL: 53 mg/dL (ref >39)
LDL Chol Calc (NIH): 130 mg/dL — ABNORMAL HIGH (ref 0–99)
Triglycerides: 70 mg/dL (ref 0–149)
VLDL Cholesterol Cal: 13 mg/dL (ref 5–40)

## 2023-04-29 LAB — RPR: RPR Ser Ql: NONREACTIVE

## 2023-05-06 LAB — CYTOLOGY - PAP
Comment: NEGATIVE
Diagnosis: UNDETERMINED — AB
High risk HPV: NEGATIVE

## 2023-06-27 ENCOUNTER — Ambulatory Visit (HOSPITAL_BASED_OUTPATIENT_CLINIC_OR_DEPARTMENT_OTHER): Payer: Medicaid Other | Attending: Nurse Practitioner | Admitting: Internal Medicine

## 2023-07-20 ENCOUNTER — Other Ambulatory Visit: Payer: Self-pay | Admitting: Nurse Practitioner

## 2023-07-20 ENCOUNTER — Encounter: Payer: Self-pay | Admitting: Nurse Practitioner

## 2023-07-20 ENCOUNTER — Ambulatory Visit (INDEPENDENT_AMBULATORY_CARE_PROVIDER_SITE_OTHER): Admitting: Nurse Practitioner

## 2023-07-20 ENCOUNTER — Telehealth: Payer: Self-pay

## 2023-07-20 VITALS — Ht 67.0 in | Wt 207.0 lb

## 2023-07-20 DIAGNOSIS — Z113 Encounter for screening for infections with a predominantly sexual mode of transmission: Secondary | ICD-10-CM

## 2023-07-20 DIAGNOSIS — R350 Frequency of micturition: Secondary | ICD-10-CM | POA: Insufficient documentation

## 2023-07-20 DIAGNOSIS — N3000 Acute cystitis without hematuria: Secondary | ICD-10-CM

## 2023-07-20 LAB — POCT URINALYSIS DIP (CLINITEK)
Bilirubin, UA: NEGATIVE
Blood, UA: NEGATIVE
Glucose, UA: NEGATIVE mg/dL
Nitrite, UA: NEGATIVE
POC PROTEIN,UA: NEGATIVE
Spec Grav, UA: 1.025 (ref 1.010–1.025)
Urobilinogen, UA: 0.2 U/dL
pH, UA: 6.5 (ref 5.0–8.0)

## 2023-07-20 MED ORDER — SULFAMETHOXAZOLE-TRIMETHOPRIM 800-160 MG PO TABS: 1.0000 | ORAL_TABLET | Freq: Two times a day (BID) | ORAL | 0 refills | Status: AC

## 2023-07-20 NOTE — Telephone Encounter (Signed)
 Sent to [employer]

## 2023-07-20 NOTE — Addendum Note (Signed)
 Addended by: Donell Beers on: 07/20/2023 12:13 PM   Modules accepted: Level of Service

## 2023-07-22 ENCOUNTER — Other Ambulatory Visit: Payer: Self-pay | Admitting: Nurse Practitioner

## 2023-07-22 DIAGNOSIS — B3731 Acute candidiasis of vulva and vagina: Secondary | ICD-10-CM

## 2023-07-22 MED ORDER — FLUCONAZOLE 150 MG PO TABS: 150.0000 mg | ORAL_TABLET | Freq: Once | ORAL | 0 refills | Status: AC

## 2023-07-22 NOTE — Progress Notes (Addendum)
 Patient walked in and stated that she had burning and itching in her vagina area for two days after having intercourse. Lab preformed and will advise provider. CB   I spoke with the patient who reported dysuria that started 2 days after having intercourse. UA showed small leucocytes , negative nitrite, Bactrim DS , 1 tablet BID for 3 days ordered.

## 2023-07-22 NOTE — Progress Notes (Unsigned)
 Marland Kitchen

## 2023-07-23 LAB — NUSWAB VAGINITIS PLUS (VG+)
Candida albicans, NAA: POSITIVE — AB
Candida glabrata, NAA: NEGATIVE
Chlamydia trachomatis, NAA: NEGATIVE
Neisseria gonorrhoeae, NAA: NEGATIVE
Trich vag by NAA: NEGATIVE

## 2023-07-25 ENCOUNTER — Ambulatory Visit: Payer: Self-pay | Admitting: Nurse Practitioner

## 2023-12-01 ENCOUNTER — Emergency Department (HOSPITAL_COMMUNITY): Admission: EM | Admit: 2023-12-01 | Discharge: 2023-12-02 | Disposition: A | Discharge status: Home / Self Care

## 2023-12-01 DIAGNOSIS — Z91040 Latex allergy status: Secondary | ICD-10-CM | POA: Insufficient documentation

## 2023-12-01 DIAGNOSIS — R1031 Right lower quadrant pain: Secondary | ICD-10-CM | POA: Diagnosis present

## 2023-12-01 LAB — CBC WITH DIFFERENTIAL/PLATELET
Abs Immature Granulocytes: 0.02 K/uL (ref 0.00–0.07)
Basophils Absolute: 0 K/uL (ref 0.0–0.1)
Basophils Relative: 1 %
Eosinophils Absolute: 0.1 K/uL (ref 0.0–0.5)
Eosinophils Relative: 1 %
HCT: 39.2 % (ref 36.0–46.0)
Hemoglobin: 12.5 g/dL (ref 12.0–15.0)
Immature Granulocytes: 0 %
Lymphocytes Relative: 12 %
Lymphs Abs: 1 K/uL (ref 0.7–4.0)
MCH: 26.2 pg (ref 26.0–34.0)
MCHC: 31.9 g/dL (ref 30.0–36.0)
MCV: 82 fL (ref 80.0–100.0)
Monocytes Absolute: 0.3 K/uL (ref 0.1–1.0)
Monocytes Relative: 4 %
Neutro Abs: 6.8 K/uL (ref 1.7–7.7)
Neutrophils Relative %: 82 %
Platelets: 369 K/uL (ref 150–400)
RBC: 4.78 MIL/uL (ref 3.87–5.11)
RDW: 14.1 % (ref 11.5–15.5)
WBC: 8.3 K/uL (ref 4.0–10.5)
nRBC: 0 % (ref 0.0–0.2)

## 2023-12-01 LAB — URINALYSIS, ROUTINE W REFLEX MICROSCOPIC
Bacteria, UA: NONE SEEN
Bilirubin Urine: NEGATIVE
Glucose, UA: NEGATIVE mg/dL
Hgb urine dipstick: NEGATIVE
Ketones, ur: NEGATIVE mg/dL
Leukocytes,Ua: NEGATIVE
Nitrite: NEGATIVE
Protein, ur: 30 mg/dL — AB
Specific Gravity, Urine: 1.028 (ref 1.005–1.030)
pH: 5 (ref 5.0–8.0)

## 2023-12-01 LAB — COMPREHENSIVE METABOLIC PANEL WITH GFR
ALT: 19 U/L (ref 0–44)
AST: 23 U/L (ref 15–41)
Albumin: 3.5 g/dL (ref 3.5–5.0)
Alkaline Phosphatase: 63 U/L (ref 38–126)
Anion gap: 10 (ref 5–15)
BUN: 14 mg/dL (ref 6–20)
CO2: 23 mmol/L (ref 22–32)
Calcium: 8.9 mg/dL (ref 8.9–10.3)
Chloride: 106 mmol/L (ref 98–111)
Creatinine, Ser: 0.98 mg/dL (ref 0.44–1.00)
GFR, Estimated: >60 mL/min (ref >60)
Glucose, Bld: 110 mg/dL — ABNORMAL HIGH (ref 70–99)
Potassium: 4 mmol/L (ref 3.5–5.1)
Sodium: 139 mmol/L (ref 135–145)
Total Bilirubin: 0.6 mg/dL (ref 0.0–1.2)
Total Protein: 6.7 g/dL (ref 6.5–8.1)

## 2023-12-01 LAB — LIPASE, BLOOD: Lipase: 28 U/L (ref 11–51)

## 2023-12-01 MED ORDER — SODIUM CHLORIDE 0.9 % IV BOLUS
1000.0000 mL | Freq: Once | INTRAVENOUS | Status: AC
  Administered 2023-12-01: 1000 mL via INTRAVENOUS

## 2023-12-01 MED ORDER — KETOROLAC TROMETHAMINE 30 MG/ML IJ SOLN
15.0000 mg | Freq: Once | INTRAMUSCULAR | Status: AC
  Administered 2023-12-01: 15 mg via INTRAVENOUS
  Filled 2023-12-01: qty 1

## 2023-12-01 NOTE — ED Provider Notes (Signed)
 [place] EMERGENCY DEPARTMENT AT [facility] Provider Note   CSN: [id number] Arrival date & time: 12/01/23  1902     Patient presents with: Abdominal Pain   Carla [name] is a 33 y.o. female.   33 year old female presents for evaluation of abdominal pain.  States she has had right lower quadrant abdominal pain for 4 days.  States she has a history of ovarian cyst and is status post ovarian removal but does not remember which side.  States has been nauseous but no vomiting.  Denies any other symptoms or concerns at this time.   Abdominal Pain Associated symptoms: no chest pain, no chills, no cough, no dysuria, no fever, no hematuria, no shortness of breath, no sore throat and no vomiting        Prior to Admission medications   Medication Sig Start Date End Date Taking? Authorizing Provider  acetaminophen  (TYLENOL ) 500 MG tablet Take 500 mg by mouth every 6 (six) hours as needed. pain    [provider]  carbamide peroxide (DEBROX) 6.5 % OTIC solution Place 5 drops into both ears 2 (two) times daily. Patient not taking: Reported on 07/20/2023 04/26/23   Paseda, Folashade R, FNP  fluticasone  (FLONASE ) 50 MCG/ACT nasal spray Place 2 sprays into both nostrils daily. Patient not taking: Reported on 07/20/2023 04/26/23   Paseda, Folashade R, FNP  HYDROcodone-acetaminophen  (NORCO/VICODIN) 5-325 MG tablet Take by mouth. Patient not taking: Reported on 07/20/2023 04/21/23   [provider]  Multiple Vitamins-Minerals (CENTRUM ADULT PO) Take by mouth.    [provider]  oxyCODONE -acetaminophen  (PERCOCET/ROXICET) 5-325 MG tablet Take 1 tablet by mouth every 6 (six) hours as needed. Patient not taking: Reported on 07/20/2023 02/07/23   [provider]  sulfamethoxazole -trimethoprim  (BACTRIM  DS) 800-160 MG tablet Take 1 tablet by mouth 2 (two) times daily. 07/20/23   Paseda, Folashade R, FNP    Allergies: Latex    Review of Systems   Constitutional:  Negative for chills and fever.  HENT:  Negative for ear pain and sore throat.   Eyes:  Negative for pain and visual disturbance.  Respiratory:  Negative for cough and shortness of breath.   Cardiovascular:  Negative for chest pain and palpitations.  Gastrointestinal:  Positive for abdominal pain. Negative for vomiting.  Genitourinary:  Negative for dysuria and hematuria.  Musculoskeletal:  Negative for arthralgias and back pain.  Skin:  Negative for color change and rash.  Neurological:  Negative for seizures and syncope.  All other systems reviewed and are negative.   Updated Vital Signs BP 112/68 (BP Location: Right Arm)   Pulse 90   Temp 97.7 F (36.5 C)   Resp 16   SpO2 100%   Physical Exam Vitals and nursing note reviewed.  Constitutional:      General: She is not in acute distress.    Appearance: She is well-developed. She is not ill-appearing.  HENT:     Head: Normocephalic and atraumatic.  Eyes:     Conjunctiva/sclera: Conjunctivae normal.  Cardiovascular:     Rate and Rhythm: Normal rate and regular rhythm.     Heart sounds: No murmur heard. Pulmonary:     Effort: Pulmonary effort is normal. No respiratory distress.     Breath sounds: Normal breath sounds.  Abdominal:     Palpations: Abdomen is soft.     Tenderness: There is abdominal tenderness in the right lower quadrant.  Musculoskeletal:        General: No swelling.  Cervical back: Neck supple.  Skin:    General: Skin is warm and dry.     Capillary Refill: Capillary refill takes less than 2 seconds.  Neurological:     Mental Status: She is alert.  Psychiatric:        Mood and Affect: Mood normal.     (all labs ordered are listed, but only abnormal results are displayed) Labs Reviewed  COMPREHENSIVE METABOLIC PANEL WITH GFR - Abnormal; Notable for the following components:      Result Value   Glucose, Bld 110 (*)    All other components within normal limits  URINALYSIS,  ROUTINE W REFLEX MICROSCOPIC - Abnormal; Notable for the following components:   Protein, ur 30 (*)    All other components within normal limits  CBC WITH DIFFERENTIAL/PLATELET  LIPASE, BLOOD  HCG, SERUM, QUALITATIVE    EKG: EKG Interpretation Date/Time:  Thursday December 01 2023 19:57:04 EDT Ventricular Rate:  83 PR Interval:  150 QRS Duration:  72 QT Interval:  362 QTC Calculation: 425 R Axis:   65  Text Interpretation: Normal sinus rhythm Nonspecific T waves Compared with prior EKG from 07/12/2011 Confirmed by Gennaro Bouchard ([id number]) on 12/01/2023 10:19:52 PM  Radiology: No results found.   Procedures   Medications Ordered in the ED  ketorolac  (TORADOL ) 30 MG/ML injection 15 mg (15 mg Intravenous Given 12/01/23 2254)  sodium chloride  0.9 % bolus 1,000 mL (1,000 mLs Intravenous New Bag/Given 12/01/23 2255)                                    Medical Decision Making Patient here for right lower quadrant abdominal pain.  Vitals are stable and labs are unremarkable but she is tender on exam.  There is no guarding.  She was given Toradol  Zofran  and IV fluids.  CT abdomen pelvis is pending at this time. Patient signed out to night time provider at 11:30 pm pending remainder of workup and ultimate disposition.   Problems Addressed: Right lower quadrant abdominal pain: undiagnosed new problem with uncertain prognosis  Amount and/or Complexity of Data Reviewed External Data Reviewed: notes.    Details: On review of patient's chart she had a right ovary removed in 2015 after a tumor was found Labs: ordered. Decision-making details documented in ED Course.    Details: Ordered and reviewed by me and fairly unremarkable, no leukocytosis and electrolytes are within normal limits Radiology: ordered.    Details: CT abdomen pelvis is pending  Risk OTC drugs. Prescription drug management. Drug therapy requiring intensive monitoring for toxicity.    Final diagnoses:  Right lower  quadrant abdominal pain    ED Discharge Orders     None          Gennaro Bouchard CROME, DO 12/01/23 2304

## 2023-12-01 NOTE — ED Provider Triage Note (Signed)
 Emergency Medicine Provider Triage Evaluation Note  Carla [name] , a 33 y.o. female  was evaluated in triage.  Pt complains of right lower quad abdominal pain that began 3 days ago acutely, progressively worsened each day, similar to the previous and that she had a hemorrhagic cyst.  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  Reports that she has had normal bowel movements, denies vomiting but endorses nausea. Endorses palpitations Denies fever, headache, chest pain, shortness of breath, diarrhea, hematochezia, constipation, melena, lower leg swelling.  Review of Systems  Positive: N/a Negative: N/a  Physical Exam  BP 112/68 (BP Location: Right Arm)   Pulse 90   Temp 97.7 F (36.5 C)   Resp 16   SpO2 100%  Gen:   Awake, no distress   Resp:  Normal effort  MSK:   Moves extremities without difficulty  Other:    Medical Decision Making  Medically screening exam initiated at 7:51 PM.  Appropriate orders placed.  Carla [name] was informed that the remainder of the evaluation will be completed by another provider, this initial triage assessment does not replace that evaluation, and the importance of remaining in the ED until their evaluation is complete.     Beola Terrall RAMAN, PA-C 12/01/23 [phone]

## 2023-12-01 NOTE — ED Provider Notes (Signed)
 11:01 PM Assumed care from Dr. Gennaro, please see their note for full history, physical and decision making until this point. In brief this is a 33 y.o. year old female who presented to the ED tonight with Abdominal Pain     Pending CT for RLQ pain. Reeval and disposition.   Discharge instructions, including strict return precautions for new or worsening symptoms, given. Patient and/or family verbalized understanding and agreement with the plan as described.   Labs, studies and imaging reviewed by myself and considered in medical decision making if ordered. Imaging interpreted by radiology.  Labs Reviewed  COMPREHENSIVE METABOLIC PANEL WITH GFR - Abnormal; Notable for the following components:      Result Value   Glucose, Bld 110 (*)    All other components within normal limits  URINALYSIS, ROUTINE W REFLEX MICROSCOPIC - Abnormal; Notable for the following components:   Protein, ur 30 (*)    All other components within normal limits  CBC WITH DIFFERENTIAL/PLATELET  LIPASE, BLOOD  HCG, SERUM, QUALITATIVE    CT ABDOMEN PELVIS W CONTRAST    (Results Pending)    No follow-ups on file.

## 2023-12-01 NOTE — ED Triage Notes (Signed)
 POV/ ambulatory/ pt c/o RLQ pain x4 days/ also c/o nausea/ hx of ovarian cysts/surgery/ A&OX4

## 2023-12-02 ENCOUNTER — Emergency Department (HOSPITAL_COMMUNITY)

## 2023-12-02 LAB — HCG, SERUM, QUALITATIVE: Preg, Serum: NEGATIVE

## 2023-12-02 MED ORDER — IOHEXOL 350 MG/ML SOLN
75.0000 mL | Freq: Once | INTRAVENOUS | Status: AC | PRN
  Administered 2023-12-02: 75 mL via INTRAVENOUS

## 2024-02-14 ENCOUNTER — Emergency Department (HOSPITAL_COMMUNITY)
Admission: EM | Admit: 2024-02-14 | Discharge: 2024-02-14 | Disposition: A | Discharge status: Home / Self Care | Attending: Emergency Medicine | Admitting: Emergency Medicine

## 2024-02-14 ENCOUNTER — Emergency Department (HOSPITAL_COMMUNITY)

## 2024-02-14 DIAGNOSIS — R1031 Right lower quadrant pain: Secondary | ICD-10-CM | POA: Diagnosis present

## 2024-02-14 DIAGNOSIS — R112 Nausea with vomiting, unspecified: Secondary | ICD-10-CM | POA: Insufficient documentation

## 2024-02-14 DIAGNOSIS — Z8543 Personal history of malignant neoplasm of ovary: Secondary | ICD-10-CM | POA: Diagnosis not present

## 2024-02-14 LAB — URINALYSIS, ROUTINE W REFLEX MICROSCOPIC
Bilirubin Urine: NEGATIVE
Glucose, UA: NEGATIVE mg/dL
Hgb urine dipstick: NEGATIVE
Ketones, ur: NEGATIVE mg/dL
Leukocytes,Ua: NEGATIVE
Nitrite: NEGATIVE
Protein, ur: NEGATIVE mg/dL
Specific Gravity, Urine: 1.028 (ref 1.005–1.030)
pH: 7 (ref 5.0–8.0)

## 2024-02-14 LAB — COMPREHENSIVE METABOLIC PANEL WITH GFR
ALT: 27 U/L (ref 0–44)
AST: 29 U/L (ref 15–41)
Albumin: 3.4 g/dL — ABNORMAL LOW (ref 3.5–5.0)
Alkaline Phosphatase: 57 U/L (ref 38–126)
Anion gap: 11 (ref 5–15)
BUN: 18 mg/dL (ref 6–20)
CO2: 18 mmol/L — ABNORMAL LOW (ref 22–32)
Calcium: 8.4 mg/dL — ABNORMAL LOW (ref 8.9–10.3)
Chloride: 105 mmol/L (ref 98–111)
Creatinine, Ser: 0.79 mg/dL (ref 0.44–1.00)
GFR, Estimated: >60 mL/min (ref >60)
Glucose, Bld: 152 mg/dL — ABNORMAL HIGH (ref 70–99)
Potassium: 4.2 mmol/L (ref 3.5–5.1)
Sodium: 134 mmol/L — ABNORMAL LOW (ref 135–145)
Total Bilirubin: 0.3 mg/dL (ref 0.0–1.2)
Total Protein: 6.7 g/dL (ref 6.5–8.1)

## 2024-02-14 LAB — CBC
HCT: 39.3 % (ref 36.0–46.0)
Hemoglobin: 12.5 g/dL (ref 12.0–15.0)
MCH: 26 pg (ref 26.0–34.0)
MCHC: 31.8 g/dL (ref 30.0–36.0)
MCV: 81.7 fL (ref 80.0–100.0)
Platelets: 369 K/uL (ref 150–400)
RBC: 4.81 MIL/uL (ref 3.87–5.11)
RDW: 14.4 % (ref 11.5–15.5)
WBC: 5.6 K/uL (ref 4.0–10.5)
nRBC: 0 % (ref 0.0–0.2)

## 2024-02-14 LAB — LIPASE, BLOOD: Lipase: 27 U/L (ref 11–51)

## 2024-02-14 LAB — HCG, SERUM, QUALITATIVE: Preg, Serum: NEGATIVE

## 2024-02-14 MED ORDER — POLYETHYLENE GLYCOL 3350 17 G PO PACK: 17.0000 g | PACK | Freq: Every day | ORAL | 0 refills | Status: AC

## 2024-02-14 MED ORDER — ONDANSETRON HCL 4 MG/2ML IJ SOLN
4.0000 mg | Freq: Once | INTRAMUSCULAR | Status: AC
  Administered 2024-02-14: 4 mg via INTRAVENOUS
  Filled 2024-02-14: qty 2

## 2024-02-14 MED ORDER — OXYCODONE-ACETAMINOPHEN 5-325 MG PO TABS
1.0000 | ORAL_TABLET | Freq: Once | ORAL | Status: AC
  Administered 2024-02-14: 1 via ORAL
  Filled 2024-02-14: qty 1

## 2024-02-14 MED ORDER — HYDROMORPHONE HCL 1 MG/ML IJ SOLN
0.5000 mg | Freq: Once | INTRAMUSCULAR | Status: AC
  Administered 2024-02-14: 0.5 mg via INTRAVENOUS
  Filled 2024-02-14: qty 1

## 2024-02-14 MED ORDER — IOHEXOL 350 MG/ML SOLN
75.0000 mL | Freq: Once | INTRAVENOUS | Status: AC | PRN
  Administered 2024-02-14: 75 mL via INTRAVENOUS

## 2024-02-14 MED ORDER — SODIUM CHLORIDE 0.9 % IV BOLUS
1000.0000 mL | Freq: Once | INTRAVENOUS | Status: AC
  Administered 2024-02-14: 1000 mL via INTRAVENOUS

## 2024-02-14 NOTE — ED Triage Notes (Signed)
 Pt. Stated, I started having lower right side pain around 0300 this morning with N/V. Pt having N/V in triage.

## 2024-02-14 NOTE — Discharge Instructions (Signed)
 Findings likely representing residual right ovarian tissue in a  patient with history of prior ovarian cystectomy. Correlation with  nonemergent pelvic MRI is recommended to exclude the presence of an  underlying neoplastic process.    We evaluated you for your abdominal pain.  Your ultrasound and CT scan were reassuring.  We did not find any dangerous cause of your pain today.  Your CT scan did show some signs of constipation which may be contributing.  We have given you a stool softener to take at home.  Please take Tylenol  (acetaminophen ) and Motrin  (ibuprofen ) for your symptoms at home.  You can take 1000 mg of Tylenol  every 6 hours and 600 mg of Motrin  every 6 hours as needed for your symptoms.  You can take these medicines together as needed, either at the same time, or alternating every 3 hours.   Your ultrasound did show a possible abnormal finding.  Please discuss with your OB/GYN whether you need to have an MRI performed of your pelvis.  We do not think this is causing your pain.  If you have any new or worsening symptoms such as recurrent or severe pain, vomiting, inability to eat, fevers, or any other new symptoms, please return to the emergency department.

## 2024-02-14 NOTE — ED Notes (Addendum)
 Patient transported to Ultrasound

## 2024-02-14 NOTE — ED Provider Notes (Signed)
 [place] EMERGENCY DEPARTMENT AT [facility] Provider Note  CSN: [id number] Arrival date & time: 02/14/24 9251  Chief Complaint(s) Abdominal Pain, Emesis, and Nausea  HPI Carla [name] is a 33 y.o. female history of obesity, prior borderline right ovarian tumor presenting to the emergency department with right lower abdominal pain.  Pain began suddenly, woke up from sleep.  Associated with nausea and vomiting.  No fevers or chills.  No diarrhea.  Was seen in ER recently for similar, had CT scan which was negative.  Denies any urinary symptoms, vaginal bleeding.   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   Pregnancy headache    Pregnancy induced hypertension    S/P cesarean section 04/01/2014   S/P emergency cesarean section 01/01/2012   Patient Active Problem List   Diagnosis Date Noted   Urine frequency 07/20/2023   Anxiety and depression 04/26/2023   Screening for endocrine, nutritional, metabolic and immunity disorder 04/26/2023   Chest pain 04/26/2023   Insomnia 04/26/2023   Allergic rhinitis 04/26/2023   Screening for cervical cancer 04/26/2023   Palpitations 04/26/2023   Excessive wax in both ears 04/26/2023   Screen for STD (sexually transmitted disease) 04/26/2023   Obesity (BMI 30-39.9) 04/26/2023   S/P cesarean section 04/01/2014   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 Authorizing Provider  polyethylene glycol (MIRALAX) 17 g packet Take 17 g by mouth daily. 02/14/24  Yes Francesca Elsie CROME, MD  acetaminophen  (TYLENOL ) 500 MG tablet Take 500 mg by mouth every 6 (six) hours as needed. pain    [provider]  Multiple Vitamins-Minerals (CENTRUM ADULT PO) Take by mouth.    [provider]  sulfamethoxazole -trimethoprim  (BACTRIM  DS) 800-160 MG tablet Take 1 tablet by mouth 2 (two) times daily. 07/20/23   Paseda, Folashade R, FNP                                                                                                                                    Past Surgical History Past Surgical History:  Procedure Laterality Date   CESAREAN SECTION  01/01/2012   Procedure: CESAREAN SECTION;  Surgeon: Ezzie Marshall, MD;  Location: [facility];  Service: Gynecology;  Laterality: N/A;  Primary cesarean section of baby  boy at 78   CESAREAN SECTION Right 04/01/2014  Procedure: CESAREAN SECTION, with Right Salpingo Oophorectomy;  Surgeon: Ezzie Buba, MD;  Location: [facility];  Service: Obstetrics;  Laterality: Right;   JOINT REPLACEMENT Left    Knee   ovarian cystectomy     Family History Family History  Problem Relation Age of Onset   ADD / ADHD Mother    High blood pressure Mother    ADD / ADHD Brother    Diabetes Maternal Aunt    Diabetes Maternal Uncle    Hypercholesterolemia Maternal Grandmother    High blood pressure Maternal Grandmother    Diabetes Maternal Grandmother    High blood pressure Other    High Cholesterol Other     Social History Social History   Tobacco Use   Smoking status: Never   Smokeless tobacco: Never  Substance Use Topics   Alcohol use: No   Drug use: No   Allergies Latex  Review of Systems Review of Systems  All other systems reviewed and are negative.   Physical Exam Vital Signs  I have reviewed the triage vital signs BP 126/75   Pulse 72   Temp 98.2 F (36.8 C) (Oral)    Resp 17   LMP 02/06/2024   SpO2 97%  Physical Exam Vitals and nursing note reviewed.  Constitutional:      General: She is not in acute distress.    Appearance: She is well-developed.  HENT:     Head: Normocephalic and atraumatic.     Mouth/Throat:     Mouth: Mucous membranes are moist.  Eyes:     Pupils: Pupils are equal, round, and reactive to light.  Cardiovascular:     Rate and Rhythm: Normal rate and regular rhythm.     Heart sounds: No murmur heard. Pulmonary:     Effort: Pulmonary effort is normal. No respiratory distress.     Breath sounds: Normal breath sounds.  Abdominal:     General: Abdomen is flat.     Palpations: Abdomen is soft.     Tenderness: There is abdominal tenderness in the right lower quadrant.  Musculoskeletal:        General: No tenderness.     Right lower leg: No edema.     Left lower leg: No edema.  Skin:    General: Skin is warm and dry.  Neurological:     General: No focal deficit present.     Mental Status: She is alert. Mental status is at baseline.  Psychiatric:        Mood and Affect: Mood normal.        Behavior: Behavior normal.     ED Results and Treatments Labs (all labs ordered are listed, but only abnormal results are displayed) Labs Reviewed  COMPREHENSIVE METABOLIC PANEL WITH GFR - Abnormal; Notable for the following components:      Result Value   Sodium 134 (*)    CO2 18 (*)    Glucose, Bld 152 (*)    Calcium  8.4 (*)    Albumin 3.4 (*)    All other components within normal limits  LIPASE, BLOOD  CBC  URINALYSIS, ROUTINE W REFLEX MICROSCOPIC  HCG, SERUM, QUALITATIVE  Radiology CT ABDOMEN PELVIS W CONTRAST Result Date: 02/14/2024 EXAM: CT ABDOMEN AND PELVIS WITH CONTRAST 02/14/2024 12:39:20 PM TECHNIQUE: CT of the abdomen and pelvis was performed with the administration of 75 mL of iohexol   (OMNIPAQUE ) 350 MG/ML injection. Multiplanar reformatted images are provided for review. Automated exposure control, iterative reconstruction, and/or weight-based adjustment of the mA/kV was utilized to reduce the radiation dose to as low as reasonably achievable. COMPARISON: Pelvic ultrasound of earlier today. CT of 12/02/2023. CLINICAL HISTORY: RLQ abdominal pain. FINDINGS: LOWER CHEST: Tiny Hiatal hernia. LIVER: The liver is unremarkable. GALLBLADDER AND BILE DUCTS: Gallbladder is unremarkable. No biliary ductal dilatation. SPLEEN: No acute abnormality. PANCREAS: No acute abnormality. ADRENAL GLANDS: No acute abnormality. KIDNEYS, URETERS AND BLADDER: No hydronephrosis. No perinephric or periureteral stranding. Urinary bladder is unremarkable. GI AND BOWEL: Normal distal stomach. Colonic stool burden suggests constipation. Normal terminal ileum. Normal appendix including on image 60/2, without other explanation for right lower quadrant pain. Normal small bowel. PERITONEUM AND RETROPERITONEUM: Trace pelvic fluid is likely physiologic. No free intraperitoneal air. VASCULATURE: Aorta is normal in caliber. LYMPH NODES: No lymphadenopathy. REPRODUCTIVE ORGANS: Normal uterus. No dominant adnexal mass. BONES AND SOFT TISSUES: No acute osseous abnormality. IMPRESSION: 1. Normal appendix, without explanation for right lower quadrant pain. 2. Colonic stool burden suggests constipation. 3. Tiny Hiatal hernia Electronically signed by: Rockey Kilts MD 02/14/2024 01:26 PM EDT RP Workstation: [id number]   US  Pelvis Complete Result Date: 02/14/2024 CLINICAL DATA:  History of prior right-sided ovarian cystectomy presenting with right lower quadrant pain. EXAM: TRANSABDOMINAL AND TRANSVAGINAL ULTRASOUND OF PELVIS DOPPLER ULTRASOUND OF OVARIES TECHNIQUE: Both transabdominal and transvaginal ultrasound examinations of the pelvis were performed. Transabdominal technique was performed for global imaging of the pelvis including  uterus, ovaries, adnexal regions, and pelvic cul-de-sac. It was necessary to proceed with endovaginal exam following the transabdominal exam to visualize the uterus, endometrium, left ovary and bilateral adnexa. Color and duplex Doppler ultrasound was utilized to evaluate blood flow to the ovaries. COMPARISON:  None Available. FINDINGS: Uterus Measurements: 8.4 cm x 4.2 cm x 5.9 cm = volume: 110.9 mL. No fibroids or other mass visualized. Endometrium Thickness: 2.9 mm.  No focal abnormality visualized. Right ovary A 3.0 cm x 1.9 cm x 1.8 cm heterogeneous, hypoechoic structure is seen within the right adnexa. Left ovary Measurements: 2.7 cm x 1.7 cm x 2.3 cm = volume: 5.7 mL. Normal appearance/no adnexal mass. Pulsed Doppler evaluation of both ovaries demonstrates normal low-resistance arterial and venous waveforms. Other findings No abnormal free fluid. IMPRESSION: Findings likely representing residual right ovarian tissue in a patient with history of prior ovarian cystectomy. Correlation with nonemergent pelvic MRI is recommended to exclude the presence of an underlying neoplastic process. Electronically Signed   By: Suzen Dials M.D.   On: 02/14/2024 11:00   US  Transvaginal Non-OB Result Date: 02/14/2024 CLINICAL DATA:  History of prior right-sided ovarian cystectomy presenting with right lower quadrant pain. EXAM: TRANSABDOMINAL AND TRANSVAGINAL ULTRASOUND OF PELVIS DOPPLER ULTRASOUND OF OVARIES TECHNIQUE: Both transabdominal and transvaginal ultrasound examinations of the pelvis were performed. Transabdominal technique was performed for global imaging of the pelvis including uterus, ovaries, adnexal regions, and pelvic cul-de-sac. It was necessary to proceed with endovaginal exam following the transabdominal exam to visualize the uterus, endometrium, left ovary and bilateral adnexa. Color and duplex Doppler ultrasound was utilized to evaluate blood flow to the ovaries. COMPARISON:  None Available.  FINDINGS: Uterus Measurements: 8.4 cm x 4.2 cm x 5.9 cm = volume: 110.9 mL.  No fibroids or other mass visualized. Endometrium Thickness: 2.9 mm.  No focal abnormality visualized. Right ovary A 3.0 cm x 1.9 cm x 1.8 cm heterogeneous, hypoechoic structure is seen within the right adnexa. Left ovary Measurements: 2.7 cm x 1.7 cm x 2.3 cm = volume: 5.7 mL. Normal appearance/no adnexal mass. Pulsed Doppler evaluation of both ovaries demonstrates normal low-resistance arterial and venous waveforms. Other findings No abnormal free fluid. IMPRESSION: Findings likely representing residual right ovarian tissue in a patient with history of prior ovarian cystectomy. Correlation with nonemergent pelvic MRI is recommended to exclude the presence of an underlying neoplastic process. Electronically Signed   By: Suzen Dials M.D.   On: 02/14/2024 11:00   US  Art/Ven Flow Abd Pelv Doppler Result Date: 02/14/2024 CLINICAL DATA:  History of prior right-sided ovarian cystectomy presenting with right lower quadrant pain. EXAM: TRANSABDOMINAL AND TRANSVAGINAL ULTRASOUND OF PELVIS DOPPLER ULTRASOUND OF OVARIES TECHNIQUE: Both transabdominal and transvaginal ultrasound examinations of the pelvis were performed. Transabdominal technique was performed for global imaging of the pelvis including uterus, ovaries, adnexal regions, and pelvic cul-de-sac. It was necessary to proceed with endovaginal exam following the transabdominal exam to visualize the uterus, endometrium, left ovary and bilateral adnexa. Color and duplex Doppler ultrasound was utilized to evaluate blood flow to the ovaries. COMPARISON:  None Available. FINDINGS: Uterus Measurements: 8.4 cm x 4.2 cm x 5.9 cm = volume: 110.9 mL. No fibroids or other mass visualized. Endometrium Thickness: 2.9 mm.  No focal abnormality visualized. Right ovary A 3.0 cm x 1.9 cm x 1.8 cm heterogeneous, hypoechoic structure is seen within the right adnexa. Left ovary Measurements: 2.7 cm x 1.7  cm x 2.3 cm = volume: 5.7 mL. Normal appearance/no adnexal mass. Pulsed Doppler evaluation of both ovaries demonstrates normal low-resistance arterial and venous waveforms. Other findings No abnormal free fluid. IMPRESSION: Findings likely representing residual right ovarian tissue in a patient with history of prior ovarian cystectomy. Correlation with nonemergent pelvic MRI is recommended to exclude the presence of an underlying neoplastic process. Electronically Signed   By: Suzen Dials M.D.   On: 02/14/2024 11:00    Pertinent labs & imaging results that were available during my care of the patient were reviewed by me and considered in my medical decision making (see MDM for details).  Medications Ordered in ED Medications  ondansetron  (ZOFRAN ) injection 4 mg (4 mg Intravenous Given 02/14/24 0907)  HYDROmorphone  (DILAUDID ) injection 0.5 mg (0.5 mg Intravenous Given 02/14/24 0907)  sodium chloride  0.9 % bolus 1,000 mL (0 mLs Intravenous Stopped 02/14/24 1351)  iohexol  (OMNIPAQUE ) 350 MG/ML injection 75 mL (75 mLs Intravenous Contrast Given 02/14/24 1240)  oxyCODONE -acetaminophen  (PERCOCET/ROXICET) 5-325 MG per tablet 1 tablet (1 tablet Oral Given 02/14/24 1357)  Procedures Procedures  (including critical care time)  Medical Decision Making / ED Course   MDM:  33 year old presenting to the emergency department with abdominal pain.  Patient overall well-appearing, physical examination with right lower quadrant tenderness.  Patient reports previous episode, given sudden onset considered ovarian torsion.  Did obtain pelvic ultrasound which shows intact bilateral flow.  Did have prior right ovarian cystectomy with borderline tumor, shows some residual tissue radiology recommends pelvic MRI, discussed this with the patient including need for follow-up with  OB/GYN.  Ultrasound pelvis otherwise up without evidence of acute cause of symptoms.  Pregnancy test is negative.  Although patient did have recent negative CT scan given recurrent pain and tenderness will obtain repeat CT scan.  Labs are overall reassuring without leukocytosis.  Will reassess.  Clinical Course as of 02/14/24 1434  Tue Feb 14, 2024  1433 Workup overall reassuring, imaging negative aside from nonspecific ovarian finding not concerning for torsion and also not suspected to be the cause of the patient's pain.  Discussed this with the patient including need for follow-up. Patient is feeling better. CT scan does show finding of possible constipation, patient reports regular bowel movement but does have large stool burden on imaging which could possibly be contributing, recommended gentle bowel regimen with MiraLAX.  Advise follow-up with OB/GYN and primary physician. Will discharge patient to home. All questions answered. Patient comfortable with plan of discharge. Return precautions discussed with patient and specified on the after visit summary.  [WS]    Clinical Course User Index [WS] Francesca Elsie CROME, MD     Additional history obtained:  -External records from outside source obtained and reviewed including: Chart review including previous notes, labs, imaging, consultation notes including prior ER visit for similar    Lab Tests: -I ordered, reviewed, and interpreted labs.   The pertinent results include:   Labs Reviewed  COMPREHENSIVE METABOLIC PANEL WITH GFR - Abnormal; Notable for the following components:      Result Value   Sodium 134 (*)    CO2 18 (*)    Glucose, Bld 152 (*)    Calcium  8.4 (*)    Albumin 3.4 (*)    All other components within normal limits  LIPASE, BLOOD  CBC  URINALYSIS, ROUTINE W REFLEX MICROSCOPIC  HCG, SERUM, QUALITATIVE    Notable for mild hyperglycemia    Imaging Studies ordered: I ordered imaging studies including US , CT scan On  my interpretation imaging demonstrates no acute process I independently visualized and interpreted imaging. I agree with the radiologist interpretation   Medicines ordered and prescription drug management: Meds ordered this encounter  Medications   ondansetron  (ZOFRAN ) injection 4 mg   HYDROmorphone  (DILAUDID ) injection 0.5 mg   sodium chloride  0.9 % bolus 1,000 mL   iohexol  (OMNIPAQUE ) 350 MG/ML injection 75 mL   oxyCODONE -acetaminophen  (PERCOCET/ROXICET) 5-325 MG per tablet 1 tablet    Refill:  0   polyethylene glycol (MIRALAX) 17 g packet    Sig: Take 17 g by mouth daily.    Dispense:  14 each    Refill:  0    -I have reviewed the patients home medicines and have made adjustments as needed  Social Determinants of Health:  Diagnosis or treatment significantly limited by social determinants of health: obesity   Reevaluation: After the interventions noted above, I reevaluated the patient and found that their symptoms have improved  Co morbidities that complicate the patient evaluation  Past Medical History:  Diagnosis Date  Anxiety    Clotting disorder    HELLP syndrome in third trimester 12/31/2011   Neuromuscular disorder (HCC)    complaints of leg pain since 6th grade   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    Final Clinical Impression(s) / ED Diagnoses Final diagnoses:  RLQ abdominal pain     This chart was dictated using voice recognition software.  Despite best efforts to proofread,  errors can occur which can change the documentation meaning.    Francesca Elsie CROME, MD 02/14/24 1435

## 2024-02-14 NOTE — ED Provider Triage Note (Signed)
 Emergency Medicine Provider Triage Evaluation Note  Carla [name] , a 33 y.o. female  was evaluated in triage.  Pt complains of right lower quadrant abdominal pain that started at 3 AM  Review of Systems  Positive: Abdominal pain, nausea, vomiting Negative: Chest pain, shortness of breath, fevers  Physical Exam  BP 131/83   Pulse 84   Temp 98 F (36.7 C) (Oral)   Resp 20   LMP 02/06/2024   SpO2 100%  Gen:   Awake, appears uncomfortable Resp:  Normal effort  MSK:   Moves extremities without difficulty  Abdomen:  RLQ tenderness to palpation   Medical Decision Making  Medically screening exam initiated at 8:15 AM.  Appropriate orders placed.  Carla [name] was informed that the remainder of the evaluation will be completed by another provider, this initial triage assessment does not replace that evaluation, and the importance of remaining in the ED until their evaluation is complete.     Gennaro Duwaine CROME, DO 02/14/24 [id number]

## 2024-06-29 ENCOUNTER — Ambulatory Visit: Payer: Self-pay | Admitting: Nurse Practitioner
